# Patient Record
Sex: Male | Born: 1990
Health system: Southern US, Community
[De-identification: ages and names within clinical notes are randomized; demographics above are authoritative.]

## PROBLEM LIST (undated history)

## (undated) DIAGNOSIS — G43909 Migraine, unspecified, not intractable, without status migrainosus: Secondary | ICD-10-CM

## (undated) DIAGNOSIS — E739 Lactose intolerance, unspecified: Secondary | ICD-10-CM

## (undated) DIAGNOSIS — R42 Dizziness and giddiness: Secondary | ICD-10-CM

## (undated) DIAGNOSIS — F988 Other specified behavioral and emotional disorders with onset usually occurring in childhood and adolescence: Secondary | ICD-10-CM

## (undated) HISTORY — DX: Lactose intolerance, unspecified: E73.9

## (undated) HISTORY — DX: Other specified behavioral and emotional disorders with onset usually occurring in childhood and adolescence: F98.8

## (undated) HISTORY — DX: Migraine, unspecified, not intractable, without status migrainosus: G43.909

## (undated) HISTORY — PX: OTHER SURGICAL HISTORY: SHX169

## (undated) HISTORY — DX: Dizziness and giddiness: R42

---

## 2008-03-26 ENCOUNTER — Emergency Department (HOSPITAL_COMMUNITY): Admission: EM | Admit: 2008-03-26 | Discharge: 2008-03-27 | Payer: Self-pay | Admitting: Emergency Medicine

## 2013-04-25 LAB — LIPID PANEL
Cholesterol: 205 mg/dL — AB (ref 0–200)
HDL: 52 mg/dL (ref 35–70)
LDL CALC: 119 mg/dL
TRIGLYCERIDES: 169 mg/dL — AB (ref 40–160)

## 2013-04-25 LAB — HEPATIC FUNCTION PANEL
ALK PHOS: 61 U/L (ref 25–125)
ALT: 28 U/L (ref 10–40)
AST: 19 U/L (ref 14–40)
BILIRUBIN, TOTAL: 0.6 mg/dL

## 2016-07-22 ENCOUNTER — Encounter (HOSPITAL_COMMUNITY): Payer: Self-pay | Admitting: Emergency Medicine

## 2016-07-22 ENCOUNTER — Emergency Department (HOSPITAL_COMMUNITY)
Admission: EM | Admit: 2016-07-22 | Discharge: 2016-07-22 | Disposition: A | Discharge status: Home / Self Care | Payer: PRIVATE HEALTH INSURANCE | Attending: Emergency Medicine | Admitting: Emergency Medicine

## 2016-07-22 DIAGNOSIS — R42 Dizziness and giddiness: Secondary | ICD-10-CM | POA: Diagnosis not present

## 2016-07-22 LAB — CBC WITH DIFFERENTIAL/PLATELET
Basophils Absolute: 0.1 10*3/uL (ref 0.0–0.1)
Basophils Relative: 1 %
Eosinophils Absolute: 0.1 10*3/uL (ref 0.0–0.7)
Eosinophils Relative: 1 %
HCT: 46.5 % (ref 39.0–52.0)
Hemoglobin: 16.1 g/dL (ref 13.0–17.0)
Lymphocytes Relative: 31 %
Lymphs Abs: 2.9 10*3/uL (ref 0.7–4.0)
MCH: 29.8 pg (ref 26.0–34.0)
MCHC: 34.6 g/dL (ref 30.0–36.0)
MCV: 86 fL (ref 78.0–100.0)
Monocytes Absolute: 1.3 10*3/uL — ABNORMAL HIGH (ref 0.1–1.0)
Monocytes Relative: 14 %
Neutro Abs: 4.9 10*3/uL (ref 1.7–7.7)
Neutrophils Relative %: 53 %
Platelets: 216 10*3/uL (ref 150–400)
RBC: 5.41 MIL/uL (ref 4.22–5.81)
RDW: 12.9 % (ref 11.5–15.5)
WBC: 9.3 10*3/uL (ref 4.0–10.5)

## 2016-07-22 LAB — BASIC METABOLIC PANEL
Anion gap: 10 (ref 5–15)
BUN: 14 mg/dL (ref 6–20)
CO2: 28 mmol/L (ref 22–32)
Calcium: 9.6 mg/dL (ref 8.9–10.3)
Chloride: 102 mmol/L (ref 101–111)
Creatinine, Ser: 0.97 mg/dL (ref 0.61–1.24)
GFR calc Af Amer: >60 mL/min (ref >60)
GFR calc non Af Amer: >60 mL/min (ref >60)
Glucose, Bld: 93 mg/dL (ref 65–99)
Potassium: 4 mmol/L (ref 3.5–5.1)
Sodium: 140 mmol/L (ref 135–145)

## 2016-07-22 MED ORDER — LORAZEPAM 1 MG PO TABS
1.0000 mg | ORAL_TABLET | Freq: Once | ORAL | Status: DC
  Filled 2016-07-22: qty 1

## 2016-07-22 MED ORDER — AMOXICILLIN-POT CLAVULANATE 875-125 MG PO TABS: 1.0000 | ORAL_TABLET | Freq: Two times a day (BID) | ORAL | 0 refills | Status: DC

## 2016-07-22 NOTE — ED Notes (Signed)
Ativan to be given before MRI. MRI states that they will call when they are ready for me to give it.

## 2016-07-22 NOTE — Progress Notes (Signed)
[facility] [place]cp was [facility] [place] Needs to establish care with a pcp locally via [employer]  Using customer service or [employer] website

## 2016-07-22 NOTE — ED Notes (Signed)
Kohut, MD verbally states he does NOT need second EKG

## 2016-07-22 NOTE — ED Provider Notes (Signed)
[facility]-EMERGENCY DEPT Provider Note   CSN: [id number] Arrival date & time: 07/22/16  1251     History   Chief Complaint Chief Complaint  Patient presents with  . Dizziness    HPI Juan Boone is a 25 y.o. male.  HPI   25 year old male with dizziness. Onset on Monday. Intermittent since then. Describes sensation of feeling unsteady. It has been most noticeable when getting out of bed this morning and yesterday morning. No appreciable exacerbating relieving factors otherwise. No ear fullness or tenderness. He is having some facial pain/pressure which he feels primarily in the frontal region behind his eyes. Been evaluated twice in the past 2 days at urgent care for the same complaints. Sounds like he was diagnosed with peripheral vertigo. Is given prescription for Flonase as well as meclizine. Tried a dose of meclizine incidentally made him drowsy. Has continued to take the Flonase. No acute visual complaints. No chest pain. No dyspnea. No palpitations.  History reviewed. No pertinent past medical history.  There are no active problems to display for this patient.   History reviewed. No pertinent surgical history.     Home Medications    Prior to Admission medications   Not on File    Family History History reviewed. No pertinent family history.  Social History Social History  Substance Use Topics  . Smoking status: Never Smoker  . Smokeless tobacco: Never Used  . Alcohol use Yes     Comment: occasional     Allergies   Gluten meal and Lactose intolerance (gi)   Review of Systems Review of Systems   All systems reviewed and negative, other than as noted in HPI.   Physical Exam Updated Vital Signs BP 144/94 (BP Location: Right Arm)   Pulse 63   Temp 97.7 F (36.5 C) (Oral)   Resp 18   Wt 194 lb 1.6 oz (88 kg)   SpO2 100%   Physical Exam  Constitutional: He appears well-developed and well-nourished. No distress.  HENT:  Head: Normocephalic and  atraumatic.  Eyes: Conjunctivae and EOM are normal. Pupils are equal, round, and reactive to light. Right eye exhibits no discharge. Left eye exhibits no discharge.  Horizontal nystagmus  Neck: Neck supple.  Cardiovascular: Normal rate, regular rhythm and normal heart sounds.  Exam reveals no gallop and no friction rub.   No murmur heard. Pulmonary/Chest: Effort normal and breath sounds normal. No respiratory distress.  Abdominal: Soft. He exhibits no distension. There is no tenderness.  Musculoskeletal: He exhibits no edema or tenderness.  Neurological: He is alert.  Skin: Skin is warm and dry.  Psychiatric: He has a normal mood and affect. His behavior is normal. Thought content normal.  Speech clear. Content appropriate. Follows commands. Cranial nerves II through XII are intact bilaterally. Good finger to nose testing bilaterally. Can stand with his feet together with eyes open without any difficulty. Was very off balance when subsequently closing his eyes. Gait stready.   Nursing note and vitals reviewed.    ED Treatments / Results  Labs (all labs ordered are listed, but only abnormal results are displayed) Labs Reviewed  CBC WITH DIFFERENTIAL/PLATELET - Abnormal; Notable for the following:       Result Value   Monocytes Absolute 1.3 (*)    All other components within normal limits  BASIC METABOLIC PANEL    EKG  EKG Interpretation  Date/Time:  Thursday July 22 2016 13:20:25 EST Ventricular Rate:  57 PR Interval:    QRS Duration: 87  QT Interval:  410 QTC Calculation: 400 R Axis:   76 Text Interpretation:  Sinus rhythm Atrial premature complex Confirmed by Juleen ChinaKOHUT  MD, Jeannett SeniorSTEPHEN ([id number]) on 07/22/2016 4:11:42 PM       Radiology No results found.  Procedures Procedures (including critical care time)  Medications Ordered in ED Medications  LORazepam (ATIVAN) tablet 1 mg (not administered)     Initial Impression / Assessment and Plan / ED Course  I have reviewed  the triage vital signs and the nursing notes.  Pertinent labs & imaging results that were available during my care of the patient were reviewed by me and considered in my medical decision making (see chart for details).  Clinical Course     25 year old male with symptoms consistent with vertigo. Exam would support this as well. I suspect this is likely peripheral in etiology. This is his third evaluation for the same complaint though. Will MRI to eval for possible central cause. He states that he does sometimes "pop" his neck and back but denies any neck pain or specific precipitating event leading up to his initial symptoms. Consider vertebral dissection, but doubt.  Unfortunately, issues with ED MRI machine. Cannot obtain this in the emergency room today. We'll try to arrange this as an outpatient. Return precautions were discussed.  Final Clinical Impressions(s) / ED Diagnoses   Final diagnoses:  Vertigo    New Prescriptions New Prescriptions   No medications on file     Raeford RazorStephen Shell Blanchette, MD 07/27/16 1126

## 2016-07-22 NOTE — ED Triage Notes (Signed)
Pt c/o dizziness, disorientation, headache onset on Monday. Went to UC, UC told him dizziness was due to middle ear crystals. Headache worsened yesterday. Dizziness worsened today.

## 2016-07-25 ENCOUNTER — Ambulatory Visit (HOSPITAL_COMMUNITY): Payer: PRIVATE HEALTH INSURANCE

## 2016-07-27 ENCOUNTER — Ambulatory Visit (HOSPITAL_COMMUNITY): Admission: RE | Admit: 2016-07-27 | Payer: PRIVATE HEALTH INSURANCE | Source: Ambulatory Visit

## 2016-07-29 ENCOUNTER — Encounter: Payer: Self-pay | Admitting: Family Medicine

## 2016-07-29 ENCOUNTER — Ambulatory Visit (INDEPENDENT_AMBULATORY_CARE_PROVIDER_SITE_OTHER): Payer: PRIVATE HEALTH INSURANCE | Admitting: Family Medicine

## 2016-07-29 VITALS — BP 126/72 | HR 75 | Temp 98.7°F | Ht 69.69 in | Wt 209.0 lb

## 2016-07-29 DIAGNOSIS — R42 Dizziness and giddiness: Secondary | ICD-10-CM | POA: Diagnosis not present

## 2016-07-29 DIAGNOSIS — G43909 Migraine, unspecified, not intractable, without status migrainosus: Secondary | ICD-10-CM | POA: Insufficient documentation

## 2016-07-29 NOTE — Patient Instructions (Signed)
Glad symptoms improving  Some features of BPPV but also features of vestibular neuritis. Set up MRI with some atypical features such as the headache.   Can do vestibular rehab exercise at home. We considered steroid taper but held off for now given improvement.

## 2016-07-29 NOTE — Progress Notes (Signed)
Phone: [phone]  Subjective:  Patient presents today to establish care. Chief complaint-noted.   See problem oriented charting  The following were reviewed and entered/updated in epic: Past Medical History:  Diagnosis Date  . Migraine    when treated for ADD years ago on adderrall- resolved off this   Patient Active Problem List   Diagnosis Date Noted  . Migraine    Past Surgical History:  Procedure Laterality Date  . none      Family History  Problem Relation Age of Onset  . Adopted: Yes  . Crohn's disease Other     unknown who in family as adopted but knows a family member had this.     Medications- reviewed and updated Current Outpatient Prescriptions  Medication Sig Dispense Refill  . fexofenadine (ALLEGRA) 180 MG tablet Take 180 mg by mouth daily.    Marland Kitchen. guaiFENesin (MUCINEX) 600 MG 12 hr tablet Take 600 mg by mouth 2 (two) times daily.    Marland Kitchen. LACTASE ENZYME PO Take 1 packet by mouth 3 (three) times daily with meals as needed (lactose intolerance).    . meclizine (ANTIVERT) 25 MG tablet Take 25 mg by mouth 3 (three) times daily as needed for dizziness or nausea.     No current facility-administered medications for this visit.     Allergies-reviewed and updated Allergies  Allergen Reactions  . Gluten Meal Other (See Comments)    Gi upset  . Lactose Intolerance (Gi) Diarrhea and Other (See Comments)    Gi upset    Social History   Social History  . Marital status: Married    Spouse name: N/A  . Number of children: N/A  . Years of education: N/A   Social History Main Topics  . Smoking status: Never Smoker  . Smokeless tobacco: Never Used  . Alcohol use Yes     Comment: occasional about 1 a week  . Drug use: No  . Sexual activity: Not Asked   Other Topics Concern  . None   Social History Narrative   Marrie.d no kids. 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 Negative for fever.  HENT: Negative for ear pain, hearing loss, sinus pain and tinnitus.   Eyes: Positive for blurred vision (from vertigo). Negative for photophobia.  Respiratory: Negative for cough and shortness of breath.   Cardiovascular: Negative for chest pain and palpitations.  Gastrointestinal: Negative for heartburn and nausea.  Genitourinary: Negative for dysuria and urgency.  Musculoskeletal: Negative for back pain, myalgias and neck pain.  Skin: Negative for itching and rash.  Neurological: Positive for dizziness (vertigo) and headaches.  Endo/Heme/Allergies: Negative for polydipsia. Does not bruise/bleed easily.  Psychiatric/Behavioral: Negative for hallucinations and substance abuse.   Objective: BP 126/72 (BP Location: Left Arm, Patient Position: Sitting, Cuff Size: Normal)   Pulse 75   Temp 98.7 F (37.1 C) (Oral)   Ht 5' 9.69" (1.77 m)   Wt 209 lb (94.8 kg)   SpO2 96%   BMI 30.26 kg/m  Gen: NAD, resting comfortably HEENT: Mucous membranes are moist. Oropharynx normal. TM normal. Eyes: sclera and lids normal, PERRLA Neck: no thyromegaly, no cervical lymphadenopathy CV: RRR no murmurs rubs or gallops Lungs: CTAB no crackles, wheeze, rhonchi Abdomen: soft/nontender/nondistended/normal bowel sounds. No rebound or guarding.  Ext: no edema Skin: warm, dry Neuro: 5/5 strength in  upper and lower extremities, normal gait, normal reflexes  Assessment/Plan:  Vertigo - Plan: MR Brain W Wo Contrast S:For the past week has been in and out of urgent care for vertigo. [facility] on [facility]. Yesterday went to ENT to rule out potential concern there- ears were normal. Said consider MRI in case neurological.   Per patient- ENT thought atypical (Dr. Lazarus SalinesWolicki)- Has had a headache since last week. Vertigo stops when he stops moving.   Last Monday he woke up and felt very dizzy/disoriented, wobbling when he moved.  Felt like each movement exagerrated. Progressively got worse through the week, except better on Wednesday. Felt mild focus issues because felt like there was movement. Worse with movement. DId have nausea, not throwing up. Had some cold sweats and had a headache. Meclizine given by [facility] last monday- advised flonase and meclizine. Tuesday called out sick. [facility] [address]- gave some head exercises (exercises for BPPV worsened BPPV). Last Thursday in ER thought peripheral but plan was for MRI but machine was broken.  A/P: we discussed this was likely either BPPV or vestibular neuritis- some blended features of both (worse with head movement for BPPV with postive dix hallpike- short episodes of worsening but on other hand has low level vertigo at all times with positive head thrust). No preceeding viral illness. ED and ENT advised MRI so we discussed likely should follow through with this- with exception patient wants to cancel if he has complete resolution before scheduled which I agreed was reasonable. THe headache also atypical but very mild at this point.   Extended counseling about vertigo and potential causes. Went over causes of vertigo sheet and answered many questions.   The duration of face-to-face time during this visit was greater than 30 minutes. Greater than 50% of this time was spent in counseling as above    Orders Placed This Encounter  Procedures  . MR Brain W Wo Contrast    Standing Status:   Future    Standing Expiration Date:   09/29/2017    Order Specific Question:   If indicated for the ordered procedure, I authorize the administration of contrast media per Radiology protocol    Answer:   Yes    Order Specific Question:   Reason for Exam (SYMPTOM  OR DIAGNOSIS REQUIRED)    Answer:   Vertigo with new onset headache, leans to left side with walking. Has been advised to have MR by ENT    Order Specific Question:   Preferred imaging location?    Answer:    [facility] (table limit-350 lbs)    Order Specific Question:   What is the patient's sedation requirement?    Answer:   No Sedation    Order Specific Question:   Does the patient have a pacemaker or implanted devices?    Answer:   No    No orders of the defined types were placed in this encounter.   Return precautions advised.  Tana ConchStephen Thessaly Mccullers, MD

## 2016-07-29 NOTE — Progress Notes (Signed)
Pre visit review using our clinic review tool, if applicable. No additional management support is needed unless otherwise documented below in the visit note. 

## 2016-08-11 ENCOUNTER — Other Ambulatory Visit: Payer: PRIVATE HEALTH INSURANCE

## 2016-08-19 ENCOUNTER — Encounter: Payer: Self-pay | Admitting: Family Medicine

## 2016-09-02 ENCOUNTER — Ambulatory Visit: Payer: PRIVATE HEALTH INSURANCE | Admitting: Family Medicine

## 2016-09-07 ENCOUNTER — Other Ambulatory Visit (INDEPENDENT_AMBULATORY_CARE_PROVIDER_SITE_OTHER): Payer: 59

## 2016-09-07 DIAGNOSIS — Z0000 Encounter for general adult medical examination without abnormal findings: Secondary | ICD-10-CM

## 2016-09-07 LAB — POC URINALSYSI DIPSTICK (AUTOMATED)
Bilirubin, UA: NEGATIVE
Glucose, UA: NEGATIVE
Ketones, UA: NEGATIVE
Leukocytes, UA: NEGATIVE
NITRITE UA: NEGATIVE
PH UA: 6.5
PROTEIN UA: NEGATIVE
RBC UA: NEGATIVE
Spec Grav, UA: 1.025
UROBILINOGEN UA: 0.2

## 2016-09-07 LAB — CBC WITH DIFFERENTIAL/PLATELET
Basophils Absolute: 0.1 10*3/uL (ref 0.0–0.1)
Basophils Relative: 0.7 % (ref 0.0–3.0)
EOS PCT: 1.2 % (ref 0.0–5.0)
Eosinophils Absolute: 0.1 10*3/uL (ref 0.0–0.7)
HCT: 46 % (ref 39.0–52.0)
Hemoglobin: 15.3 g/dL (ref 13.0–17.0)
LYMPHS ABS: 2 10*3/uL (ref 0.7–4.0)
Lymphocytes Relative: 23.4 % (ref 12.0–46.0)
MCHC: 33.2 g/dL (ref 30.0–36.0)
MCV: 87.1 fl (ref 78.0–100.0)
MONOS PCT: 9.5 % (ref 3.0–12.0)
Monocytes Absolute: 0.8 10*3/uL (ref 0.1–1.0)
NEUTROS ABS: 5.7 10*3/uL (ref 1.4–7.7)
NEUTROS PCT: 65.2 % (ref 43.0–77.0)
PLATELETS: 224 10*3/uL (ref 150.0–400.0)
RBC: 5.29 Mil/uL (ref 4.22–5.81)
RDW: 13 % (ref 11.5–15.5)
WBC: 8.7 10*3/uL (ref 4.0–10.5)

## 2016-09-07 LAB — HEPATIC FUNCTION PANEL
ALBUMIN: 4.6 g/dL (ref 3.5–5.2)
ALT: 47 U/L (ref 0–53)
AST: 21 U/L (ref 0–37)
Alkaline Phosphatase: 59 U/L (ref 39–117)
Bilirubin, Direct: 0.1 mg/dL (ref 0.0–0.3)
Total Bilirubin: 0.5 mg/dL (ref 0.2–1.2)
Total Protein: 6.9 g/dL (ref 6.0–8.3)

## 2016-09-07 LAB — LIPID PANEL
CHOL/HDL RATIO: 4
CHOLESTEROL: 215 mg/dL — AB (ref 0–200)
HDL: 52.6 mg/dL (ref >39.00)
LDL Cholesterol: 142 mg/dL — ABNORMAL HIGH (ref 0–99)
NonHDL: 162.32
TRIGLYCERIDES: 100 mg/dL (ref 0.0–149.0)
VLDL: 20 mg/dL (ref 0.0–40.0)

## 2016-09-07 LAB — BASIC METABOLIC PANEL
BUN: 15 mg/dL (ref 6–23)
CALCIUM: 9.9 mg/dL (ref 8.4–10.5)
CO2: 32 mEq/L (ref 19–32)
Chloride: 104 mEq/L (ref 96–112)
Creatinine, Ser: 0.99 mg/dL (ref 0.40–1.50)
GFR: 95.9 mL/min (ref >60.00)
GLUCOSE: 95 mg/dL (ref 70–99)
POTASSIUM: 4.6 meq/L (ref 3.5–5.1)
SODIUM: 141 meq/L (ref 135–145)

## 2016-09-07 LAB — TSH: TSH: 1.32 u[IU]/mL (ref 0.35–4.50)

## 2016-09-09 ENCOUNTER — Encounter: Payer: Self-pay | Admitting: Family Medicine

## 2016-09-09 ENCOUNTER — Ambulatory Visit (INDEPENDENT_AMBULATORY_CARE_PROVIDER_SITE_OTHER): Payer: 59 | Admitting: Family Medicine

## 2016-09-09 VITALS — BP 124/62 | HR 72 | Temp 98.2°F | Ht 70.0 in | Wt 210.6 lb

## 2016-09-09 DIAGNOSIS — Z0000 Encounter for general adult medical examination without abnormal findings: Secondary | ICD-10-CM | POA: Diagnosis not present

## 2016-09-09 DIAGNOSIS — Z23 Encounter for immunization: Secondary | ICD-10-CM

## 2016-09-09 DIAGNOSIS — K9041 Non-celiac gluten sensitivity: Secondary | ICD-10-CM | POA: Insufficient documentation

## 2016-09-09 NOTE — Patient Instructions (Addendum)
Tdap today. Good for 10 years.   Love the 28 by 28 plan!!!

## 2016-09-09 NOTE — Progress Notes (Signed)
Pre visit review using our clinic review tool, if applicable. No additional management support is needed unless otherwise documented below in the visit note. 

## 2016-09-09 NOTE — Progress Notes (Signed)
Phone: [phone]  Subjective:  Patient presents today for their annual physical. Chief complaint-noted.   See problem oriented charting- ROS- full  review of systems was completed and negative including No chest pain or shortness of breath. No headache or blurry vision. Vertigo resolved  The following were reviewed and entered/updated in epic: Past Medical History:  Diagnosis Date  . Migraine    when treated for ADD years ago on adderrall- resolved off this   Patient Active Problem List   Diagnosis Date Noted  . Gluten intolerance 09/09/2016  . Migraine    Past Surgical History:  Procedure Laterality Date  . none      Family History  Problem Relation Age of Onset  . Adopted: Yes  . Crohn's disease Other     unknown who in family as adopted but knows a family member had this.     Medications- reviewed and updated Current Outpatient Prescriptions  Medication Sig Dispense Refill  . LACTASE ENZYME PO Take 1 packet by mouth 3 (three) times daily with meals as needed (lactose intolerance).     No current facility-administered medications for this visit.     Allergies-reviewed and updated Allergies  Allergen Reactions  . Gluten Meal Other (See Comments)    Gi upset  . Lactose Intolerance (Gi) Diarrhea and Other (See Comments)    Gi upset    Social History   Social History  . Marital status: Married    Spouse name: N/A  . Number of children: N/A  . Years of education: N/A   Social History Main Topics  . Smoking status: Never Smoker  . Smokeless tobacco: Never Used  . Alcohol use Yes     Comment: occasional about 1 a week  . Drug use: No  . Sexual activity: Not Asked   Other Topics Concern  . None   Social History Narrative   Marrie.d no kids. 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 Oropharynx normal Neck: no thyromegaly CV: RRR no murmurs rubs or gallops Lungs: CTAB no crackles, wheeze, rhonchi Abdomen: soft/nontender/nondistended/normal bowel sounds. No rebound or guarding.  Ext: no edema Skin: warm, dry Neuro: grossly normal, moves all extremities, PERRLA  Declines GU exam  Assessment/Plan:  26 y.o. male presenting for annual physical.  Health Maintenance counseling: 1. Anticipatory guidance: Patient counseled regarding regular dental exams - first visit tomorrow, eye exams no issues, wearing seatbelts.  2. Risk factor reduction:  Advised patient of need for regular exercise and diet rich and fruits and vegetables to reduce risk of heart attack and stroke. Exercise- goal 150 minutes a week, only doing 1-2 days a week 4-5 days had been doing previously. Diet-eats too much at [employer]- not always picking great options, needs more meal planning. Wants to target 165-170. Lowest he has been since college 177. Wants to lose 28 lbs by time of turning 26.  Wt Readings from Last 3 Encounters:  09/09/16 210 lb 9.6 oz (95.5 kg)  07/29/16 209 lb (94.8 kg)  07/22/16 194 lb 1.6 oz (88 kg)  3.  Immunizations/screenings/ancillary studies Immunization History  Administered Date(s) Administered  . Influenza-Unspecified 08/26/2016   Health Maintenance Due  Topic Date Due  . HIV Screening - future labs 01/05/2004  . TETANUS/TDAP - today 01/05/2008   4. Prostate cancer screening- unknown family history, plan to start at 50  5. Colon cancer screening - unknown family history, plan to start at 42. Family history of crohns but no abdominal pain or blood stool 6. Skin cancer screening/prevention- advised regular sunscreen use.  7. Testicular cancer screening- advised monthly self exams  8. STD screening- patient  opts out- monogomous  Status of chronic or acute concerns  Avoiding gluten  Migraine free without add meds  Vertigo resolved. Did not get MRI as a result.   Return in about 1 year (around 09/09/2017).  Return precautions advised.   Tana Conch, MD

## 2017-05-19 ENCOUNTER — Encounter: Payer: Self-pay | Admitting: Internal Medicine

## 2017-07-13 ENCOUNTER — Ambulatory Visit (INDEPENDENT_AMBULATORY_CARE_PROVIDER_SITE_OTHER): Payer: 59 | Admitting: Internal Medicine

## 2017-07-13 ENCOUNTER — Encounter: Payer: Self-pay | Admitting: Internal Medicine

## 2017-07-13 ENCOUNTER — Other Ambulatory Visit (INDEPENDENT_AMBULATORY_CARE_PROVIDER_SITE_OTHER): Payer: 59

## 2017-07-13 VITALS — BP 116/80 | HR 64 | Ht 69.5 in | Wt 223.5 lb

## 2017-07-13 DIAGNOSIS — R109 Unspecified abdominal pain: Secondary | ICD-10-CM | POA: Diagnosis not present

## 2017-07-13 DIAGNOSIS — R197 Diarrhea, unspecified: Secondary | ICD-10-CM

## 2017-07-13 DIAGNOSIS — K219 Gastro-esophageal reflux disease without esophagitis: Secondary | ICD-10-CM

## 2017-07-13 LAB — IGA: IgA: 84 mg/dL (ref 68–378)

## 2017-07-13 MED ORDER — NA SULFATE-K SULFATE-MG SULF 17.5-3.13-1.6 GM/177ML PO SOLN: 1.0000 | Freq: Once | ORAL | 0 refills | Status: AC

## 2017-07-13 NOTE — Progress Notes (Signed)
HISTORY OF PRESENT ILLNESS:  Juan Boone is a 26 y.o. 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 The patient reports that he has had intermittent problems with "upset stomach" his entire life dating back to grade school. He was told that he is lactose intolerance and has been using Lactaid with dairy products. Despite this he reports 2-4 generally loose bowel movements daily, mostly in the morning. At times he will have postprandial lower abdominal cramping discomfort with moderate urgency followed by diarrhea. He may use the bathroom several times. After completing evacuation, the abdomen feels better. No bleeding. He reports that he will have an episode at least once per week. He also experiences nocturnal symptoms 2 or 3 times per month. He has not noticed any particular food items that reliably reproduce his symptoms. He did try gluten-free diet for approximately one year with modest benefit. He has resumed gluten in his diet for the past 3 months. He is also tried probiotics and decreased vegetable consumption without benefit. He denies weight loss. Actually, he has had 35 pound weight gain over the past 3 years. He does complain of active acid reflux symptoms 2 or 3 times per week. He will use antacids such as Tums on demand. No dysphagia. Occasional nausea. Patient's family history is uncertain as he is adopted. However he did find out that there is a family history of Crohn's disease. Stress may exacerbate symptoms at times. He denies issues with constipation. Finally, he describes a perirectal "bump" of uncertain significance. Nonpainful. Review of outside laboratories from earlier this year finds normal CBC, comprehensive metabolic panel, and TSH. No relevant radiology.  REVIEW OF SYSTEMS:  All non-GI  ROS negative unless otherwise stated in the history of present illness except for headaches, joint aches  Past Medical History:  Diagnosis Date  . ADD (attention deficit disorder)   . Lactose intolerance   . Migraine    when treated for ADD years ago on adderrall- resolved off this  . Vertigo     Past Surgical History:  Procedure Laterality Date  . none      Social History Juan Boone  reports that  has never smoked. he has never used smokeless tobacco. He reports that he drinks alcohol. He reports that he does not use drugs.  family history includes Crohn's disease in his other. He was adopted.  Allergies  Allergen Reactions  . Gluten Meal Other (See Comments)    Gi upset  . Lactose Intolerance (Gi) Diarrhea and Other (See Comments)    Gi upset  . Aleve [Naproxen Sodium] Other (See Comments)    GI upset       PHYSICAL EXAMINATION: Vital signs: BP 116/80 (BP Location: Left Arm, Patient Position: Sitting, Cuff Size: Normal)   Pulse 64   Ht 5' 9.5" (1.765 m) Comment: height measured without shoes  Wt 223 lb 8 oz (101.4 kg)   BMI 32.53 kg/m   Constitutional: generally well-appearing, no acute distress Psychiatric: alert and oriented x3, cooperative Eyes: extraocular movements intact, anicteric, conjunctiva pink Mouth: oral pharynx moist, no lesions Neck: supple no lymphadenopathy Cardiovascular: heart regular rate and rhythm, no murmur Lungs: clear to auscultation bilaterally Abdomen: soft, nontender, nondistended, no obvious ascites, no peritoneal signs, normal bowel sounds, no organomegaly Rectal: Deferred until colonoscopy Extremities: no clubbing, cyanosis, or lower extremity edema bilaterally Skin: no  lesions on visible extremities Neuro: No focal deficits. Cranial nerves intact  ASSESSMENT:  #1. Long-standing problems with increased frequency of bowel movements with intermittent episodes of postprandial abdominal cramping with urgency as described. Suspect  irritable bowel syndrome. Rule out organic entities such as macroscopic IBD or microscopic colitis. Questionable response to gluten-free diet as reported #2. Family history of Crohn's disease #3. Perirectal lesion to be evaluated #4. Chronic GERD   PLAN:  #1. Screen for celiac disease with tissue transglutaminase antibody IgA and serum IgA level #2. Schedule colonoscopy to evaluate chronic intermittent diarrhea and abdominal pain. Rule out IBD or microscopic colitis.The nature of the procedure, as well as the risks, benefits, and alternatives were carefully and thoroughly reviewed with the patient. Ample time for discussion and questions allowed. The patient understood, was satisfied, and agreed to proceed. #3. Upper endoscopy to evaluate chronic reflux symptoms and abdominal discomfort. Biopsies for celiac necessary.The nature of the procedure, as well as the risks, benefits, and alternatives were carefully and thoroughly reviewed with the patient. Ample time for discussion and questions allowed. The patient understood, was satisfied, and agreed to proceed. #4. If the above workup negative then symptomatic therapies to include fiber, antispasmodics, Xifaxan, IBgard, and the like. #5. Reflux precautions with attention to weight loss #6. A benefit from H2 receptor antagonist therapy or PPI given frequency of symptoms. We will decide post endoscopy to rule out mucosal abnormalities such as esophagitis  A copy of this consultation note has been sent to Dr. Durene CalHunter

## 2017-07-13 NOTE — Patient Instructions (Signed)
Your physician has requested that you go to the basement for the following lab work before leaving today:  TTG, IGA  You have been scheduled for an endoscopy and colonoscopy. Please follow the written instructions given to you at your visit today. Please pick up your prep supplies at the pharmacy within the next 1-3 days. If you use inhalers (even only as needed), please bring them with you on the day of your procedure. Your physician has requested that you go to www.startemmi.com and enter the access code given to you at your visit today. This web site gives a general overview about your procedure. However, you should still follow specific instructions given to you by our office regarding your preparation for the procedure.   

## 2017-07-14 LAB — TISSUE TRANSGLUTAMINASE, IGA: (tTG) Ab, IgA: 1 U/mL

## 2017-07-22 DIAGNOSIS — Z23 Encounter for immunization: Secondary | ICD-10-CM | POA: Diagnosis not present

## 2017-09-08 ENCOUNTER — Encounter: Payer: 59 | Admitting: Internal Medicine

## 2017-09-13 ENCOUNTER — Encounter: Payer: Self-pay | Admitting: Internal Medicine

## 2017-09-26 ENCOUNTER — Ambulatory Visit (AMBULATORY_SURGERY_CENTER): Payer: 59 | Admitting: Internal Medicine

## 2017-09-26 ENCOUNTER — Other Ambulatory Visit: Payer: Self-pay

## 2017-09-26 ENCOUNTER — Encounter: Payer: Self-pay | Admitting: Internal Medicine

## 2017-09-26 VITALS — BP 133/75 | HR 59 | Temp 97.1°F | Resp 10 | Ht 69.5 in | Wt 223.0 lb

## 2017-09-26 DIAGNOSIS — R109 Unspecified abdominal pain: Secondary | ICD-10-CM

## 2017-09-26 DIAGNOSIS — K219 Gastro-esophageal reflux disease without esophagitis: Secondary | ICD-10-CM

## 2017-09-26 DIAGNOSIS — R197 Diarrhea, unspecified: Secondary | ICD-10-CM

## 2017-09-26 DIAGNOSIS — K2100 Gastro-esophageal reflux disease with esophagitis, without bleeding: Secondary | ICD-10-CM

## 2017-09-26 DIAGNOSIS — R42 Dizziness and giddiness: Secondary | ICD-10-CM | POA: Diagnosis not present

## 2017-09-26 MED ORDER — DICYCLOMINE HCL 10 MG PO CAPS
20.0000 mg | ORAL_CAPSULE | Freq: Four times a day (QID) | ORAL | 3 refills | Status: DC | PRN
Start: 2017-09-26 — End: 2019-02-07

## 2017-09-26 MED ORDER — SODIUM CHLORIDE 0.9 % IV SOLN
500.0000 mL | Freq: Once | INTRAVENOUS | Status: DC
Start: 2017-09-26 — End: 2021-08-24

## 2017-09-26 MED ORDER — OMEPRAZOLE 40 MG PO CPDR: 40.0000 mg | DELAYED_RELEASE_CAPSULE | Freq: Every day | ORAL | 11 refills | Status: DC

## 2017-09-26 NOTE — Progress Notes (Signed)
Report to PACU, RN, vss, BBS= Clear.  

## 2017-09-26 NOTE — Progress Notes (Signed)
Pt. Reports no change in his medical or surgical history since his pre-visit 07/13/2017.

## 2017-09-26 NOTE — Progress Notes (Signed)
Called to room to assist during endoscopic procedure.  Patient ID and intended procedure confirmed with present staff. Received instructions for my participation in the procedure from the performing physician.  

## 2017-09-26 NOTE — Op Note (Signed)
[facility] Patient Name: Juan Boone Procedure Date: 09/26/2017 1:26 PM MRN: [id number] Endoscopist: [name]. Juan Boone , MD Age: 27 Referring MD:  Date of Birth: 09-12-1990 Gender: Male Account #: [id number] Procedure:                Upper GI endoscopy Indications:              Abdominal pain, Heartburn Medicines:                Monitored Anesthesia Care Procedure:                Pre-Anesthesia Assessment:                           - Prior to the procedure, a History and Physical                            was performed, and patient medications and                            allergies were reviewed. The patient's tolerance of                            previous anesthesia was also reviewed. The risks                            and benefits of the procedure and the sedation                            options and risks were discussed with the patient.                            All questions were answered, and informed consent                            was obtained. Prior Anticoagulants: The patient has                            taken no previous anticoagulant or antiplatelet                            agents. ASA Grade Assessment: I - A normal, healthy                            patient. After reviewing the risks and benefits,                            the patient was deemed in satisfactory condition to                            undergo the procedure.                           After obtaining informed consent, the endoscope was  passed under direct vision. Throughout the                            procedure, the patient's blood pressure, pulse, and                            oxygen saturations were monitored continuously. The                            Endoscope was introduced through the mouth, and                            advanced to the second part of duodenum. The upper                            GI endoscopy was accomplished without  difficulty.                            The patient tolerated the procedure well. Scope In: Scope Out: Findings:                 LA Grade B (one or more mucosal breaks greater than                            5 mm, not extending between the tops of two mucosal                            folds) esophagitis was found.                           The esophagus was normal. No Barrett's esophagus.                           The stomach was normal. Small hiatal hernia.                           The examined duodenum was normal.                           The cardia and gastric fundus were normal on                            retroflexion. Complications:            No immediate complications. Estimated Blood Loss:     Estimated blood loss: none. Impression:               1. GERD with erosive esophagitis. Recommendation:           1. Prescribe omeprazole 40 mg daily; #30; 11 refills                           2. Reflux precautions                           3. Office follow-up with Dr. Marina GoodellPerry in 6-8 weeks. Wilhemina BonitoJohn N.  Juan Goodell, MD 09/26/2017 1:51:44 PM This report has been signed electronically.

## 2017-09-26 NOTE — Progress Notes (Signed)
No problems noted in the recovery room. maw 

## 2017-09-26 NOTE — Patient Instructions (Addendum)
YOU HAD AN ENDOSCOPIC PROCEDURE TODAY AT [facility]:   Refer to the procedure report that was given to you for any specific questions about what was found during the examination.  If the procedure report does not answer your questions, please call your gastroenterologist to clarify.  If you requested that your care partner not be given the details of your procedure findings, then the procedure report has been included in a sealed envelope for you to review at your convenience later.  YOU SHOULD EXPECT: Some feelings of bloating in the abdomen. Passage of more gas than usual.  Walking can help get rid of the air that was put into your GI tract during the procedure and reduce the bloating. If you had a lower endoscopy (such as a colonoscopy or flexible sigmoidoscopy) you may notice spotting of blood in your stool or on the toilet paper. If you underwent a bowel prep for your procedure, you may not have a normal bowel movement for a few days.  Please Note:  You might notice some irritation and congestion in your nose or some drainage.  This is from the oxygen used during your procedure.  There is no need for concern and it should clear up in a day or so.  SYMPTOMS TO REPORT IMMEDIATELY:   Following lower endoscopy (colonoscopy or flexible sigmoidoscopy):  Excessive amounts of blood in the stool  Significant tenderness or worsening of abdominal pains  Swelling of the abdomen that is new, acute  Fever of 100F or higher   Following upper endoscopy (EGD)  Vomiting of blood or coffee ground material  New chest pain or pain under the shoulder blades  Painful or persistently difficult swallowing  New shortness of breath  Fever of 100F or higher  Black, tarry-looking stools  For urgent or emergent issues, a gastroenterologist can be reached at any hour by calling [phone].   DIET:  We do recommend a small meal at first, but then you may proceed to your regular diet.  Drink  plenty of fluids but you should avoid alcoholic beverages for 24 hours.  ACTIVITY:  You should plan to take it easy for the rest of today and you should NOT DRIVE or use heavy machinery until tomorrow (because of the sedation medicines used during the test).    FOLLOW UP: Our staff will call the number listed on your records the next business day following your procedure to check on you and address any questions or concerns that you may have regarding the information given to you following your procedure. If we do not reach you, we will leave a message.  However, if you are feeling well and you are not experiencing any problems, there is no need to return our call.  We will assume that you have returned to your regular daily activities without incident.  If any biopsies were taken you will be contacted by phone or by letter within the next 1-3 weeks.  Please call us at [phone] if you have not heard about the biopsies in 3 weeks.    SIGNATURES/CONFIDENTIALITY: You and/or your care partner have signed paperwork which will be entered into your electronic medical record.  These signatures attest to the fact that that the information above on your After Visit Summary has been reviewed and is understood.  Full responsibility of the confidentiality of this discharge information lies with you and/or your care-partner.    Handouts were given to your care partner on hemorrhoids,  GERD, and a hiatal hernia. Initiate Metamucil 2 TBSP daily in 12-14 ounces of water or juice. Rx sent in for Bentyl 20 mg -  by mouth every 6 hours as needed for abdominal pain. Rx sent in for Omeprazole 40 mg 1 daily on an empty stomach. 20 - 30 minutes before breakfast. Office visit with Dr. Marina GoodellPerry to be scheduled by the office nurse  For 6 - 8 weeks. You may resume your current medications today. Await biopsy results. Please call if any questions or concerns.

## 2017-09-26 NOTE — Op Note (Signed)
[facility] Patient Name: Juan Boone Procedure Date: 09/26/2017 1:26 PM MRN: [id number] Endoscopist: [name]. [name] , MD Age: 27 Referring MD:  Date of Birth: [birth year] Gender: Male Account #: [id number] Procedure:                Colonoscopy, with biopsies Indications:              Abdominal pain, Chronic diarrhea Medicines:                Monitored Anesthesia Care Procedure:                Pre-Anesthesia Assessment:                           - Prior to the procedure, a History and Physical                            was performed, and patient medications and                            allergies were reviewed. The patient's tolerance of                            previous anesthesia was also reviewed. The risks                            and benefits of the procedure and the sedation                            options and risks were discussed with the patient.                            All questions were answered, and informed consent                            was obtained. Prior Anticoagulants: The patient has                            taken no previous anticoagulant or antiplatelet                            agents. ASA Grade Assessment: I - A normal, healthy                            patient. After reviewing the risks and benefits,                            the patient was deemed in satisfactory condition to                            undergo the procedure.                           After obtaining informed consent, the colonoscope  was passed under direct vision. Throughout the                            procedure, the patient's blood pressure, pulse, and                            oxygen saturations were monitored continuously. The                            Colonoscope was introduced through the anus and                            advanced to the the cecum, identified by                            appendiceal orifice and ileocecal  valve. The                            terminal ileum, ileocecal valve, appendiceal                            orifice, and rectum were photographed. The quality                            of the bowel preparation was excellent. The                            colonoscopy was performed without difficulty. The                            patient tolerated the procedure well. The bowel                            preparation used was SUPREP. Scope In: 1:30:54 PM Scope Out: 1:38:28 PM Scope Withdrawal Time: 0 hours 6 minutes 35 seconds  Total Procedure Duration: 0 hours 7 minutes 34 seconds  Findings:                 The terminal ileum appeared normal.                           The entire examined colon appeared normal on direct                            and retroflexion views. Biopsies for histology were                            taken with a cold forceps from the entire colon for                            evaluation of microscopic colitis.                           Internal hemorrhoids were found during  retroflexion. The hemorrhoids were small. Complications:            No immediate complications. Estimated blood loss:                            None. Estimated Blood Loss:     Estimated blood loss: none. Impression:               - The examined portion of the ileum was normal.                           - The entire examined colon is normal on direct and                            retroflexion views.                           - Internal hemorrhoids. Recommendation:           1. Follow-up biopsies                           2. Initiate Metamucil 2 tablespoons daily in 12-14                            ounces of water or juice.                           3. Prescribed Bentyl 20 mg; #30; 3 refills. Take 1                            by mouth every 6 hours as needed for abdominal pain                           4. Upper endoscopy today. Please see report                            5. Office follow-up with Dr. [name] in 6-8 weeks. [name]. [name], MD 09/26/2017 1:48:13 PM This report has been signed electronically.

## 2017-09-27 ENCOUNTER — Telehealth: Payer: Self-pay

## 2017-09-27 NOTE — Telephone Encounter (Signed)
  Follow up Call-  Call back number 09/26/2017  Post procedure Call Back phone  # [phone]  Permission to leave phone message Yes  Some recent data might be hidden     Patient questions:  Do you have a fever, pain , or abdominal swelling? No. Pain Score  0 *  Have you tolerated food without any problems? Yes.    Have you been able to return to your normal activities? Yes.    Do you have any questions about your discharge instructions: Diet   No. Medications  No. Follow up visit  No.  Do you have questions or concerns about your Care? No.  Actions: * If pain score is 4 or above: No action needed, pain <4.

## 2017-09-30 ENCOUNTER — Encounter: Payer: Self-pay | Admitting: Internal Medicine

## 2017-10-26 MED FILL — OMEPRAZOLE DR 40 MG CAPSULE: 40 | 30 days supply | Qty: 30 | Fill #0

## 2017-11-10 ENCOUNTER — Ambulatory Visit: Payer: 59 | Admitting: Internal Medicine

## 2017-12-12 MED FILL — OMEPRAZOLE DR 40 MG CAPSULE: 40 | 30 days supply | Qty: 30 | Fill #1

## 2017-12-16 ENCOUNTER — Ambulatory Visit (INDEPENDENT_AMBULATORY_CARE_PROVIDER_SITE_OTHER): Payer: 59 | Admitting: Internal Medicine

## 2017-12-16 ENCOUNTER — Encounter: Payer: Self-pay | Admitting: Internal Medicine

## 2017-12-16 VITALS — BP 124/80 | HR 66 | Ht 69.5 in | Wt 221.0 lb

## 2017-12-16 DIAGNOSIS — K589 Irritable bowel syndrome without diarrhea: Secondary | ICD-10-CM | POA: Diagnosis not present

## 2017-12-16 DIAGNOSIS — R197 Diarrhea, unspecified: Secondary | ICD-10-CM

## 2017-12-16 DIAGNOSIS — K219 Gastro-esophageal reflux disease without esophagitis: Secondary | ICD-10-CM

## 2017-12-16 DIAGNOSIS — K2100 Gastro-esophageal reflux disease with esophagitis, without bleeding: Secondary | ICD-10-CM

## 2017-12-16 MED ORDER — RIFAXIMIN 550 MG PO TABS: 550.0000 mg | ORAL_TABLET | Freq: Three times a day (TID) | ORAL | 0 refills | Status: DC

## 2017-12-16 MED ORDER — OMEPRAZOLE 40 MG PO CPDR: 40.0000 mg | DELAYED_RELEASE_CAPSULE | Freq: Every day | ORAL | 11 refills | Status: DC

## 2017-12-16 NOTE — Progress Notes (Signed)
HISTORY OF PRESENT ILLNESS:  Juan Boone is a 27 y.o. male , at 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 He was initially evaluated 07/13/2017 regarding long-standing problems with increased frequency of bowel movements, abdominal cramping with urgency, chronic GERD. See that dictation for details. He subsequently underwent celiac testing which was negative. Thereafter complete colonoscopy and upper endoscopy were performed 09/26/2017. Colonoscopy including intubation of the terminal ileum was normal. Random colon biopsies were normal. Upper endoscopy revealed active esophagitis. He was prescribed omeprazole 40 mg daily. Also told to take Metamucil 2 tablespoons daily and Bentyl as needed for abdominal pain. The patient tells me that omeprazole has completely eliminated his reflux symptoms. He is pleased. Initially he describes decreased frequency of bowel movements with Metamucil. Thereafter had several episodes of abdominal bloating discomfort which she attributed to Metamucil. He has used Bentyl sparingly which seemed to help. No new complaints.  REVIEW OF SYSTEMS:  All non-GI ROS negative except for a sinus and allergy, sore throat, headache,  Past Medical History:  Diagnosis Date  . ADD (attention deficit disorder)   . Lactose intolerance   . Migraine    when treated for ADD years ago on adderrall- resolved off this  . Vertigo     Past Surgical History:  Procedure Laterality Date  . none      Social History Juan Boone  reports that he has never smoked. He has never used smokeless tobacco. He reports that he drinks alcohol. He reports that he does not use drugs.  family history includes Crohn's disease in his other. He was adopted.  Allergies  Allergen Reactions  . Gluten Meal Other (See Comments)    Gi upset  . Lactose Intolerance (Gi) Diarrhea and Other (See Comments)    Gi upset  .  Aleve [Naproxen Sodium] Other (See Comments)    GI upset       PHYSICAL EXAMINATION: Vital signs: BP 124/80   Pulse 66   Ht 5' 9.5" (1.765 m)   Wt 221 lb (100.2 kg)   SpO2 99%   BMI 32.17 kg/m   Constitutional: generally well-appearing, no acute distress Psychiatric: alert and oriented x3, cooperative Eyes: extraocular movements intact, anicteric, conjunctiva pink Mouth: oral pharynx moist, no lesions Neck: supple no lymphadenopathy Cardiovascular: heart regular rate and rhythm, no murmur Lungs: clear to auscultation bilaterally Abdomen: soft, nontender, nondistended, no obvious ascites, no peritoneal signs, normal bowel sounds, no organomegaly Rectal:omitted Extremities: no clubbing cyanosis or lower extremity edema bilaterally Skin: no lesions on visible extremities Neuro: No focal deficits. Cranial nerves intact  ASSESSMENT:  #1. GERD with endoscopic evidence of esophagitis. Asymptomatic on PPI #2. Diarrhea predominant irritable bowel syndrome. Ongoing   PLAN:  #1. Reflux precautions #2. Reflux precaution sheet and diet provided #3. Continue omeprazole 40 mg daily #4. Prescribe Xifaxan 550 mg 3 times a day 2 weeks #5. Continue Bentyl as needed for pain #6. Routine office follow-up 3 months  25 minutes spent face-to-face with the patient. Greater than 50% a time use for counseling regarding his GERD and chronic irritable bowel complaints

## 2017-12-16 NOTE — Patient Instructions (Signed)
We have sent the following medications to your pharmacy for you to pick up at your convenience:  Omeprazole  We have sent your demographic information and a prescription for Xifaxan to [facility]. This pharmacy is able to get medication approved through insurance and get you the lowest copay possible. If you have not heard from them within 1 week, please call our office at [phone] to let us know.  Please follow up in 3 months

## 2018-01-13 ENCOUNTER — Encounter

## 2018-02-03 ENCOUNTER — Ambulatory Visit: Payer: Self-pay | Admitting: Family Medicine

## 2018-02-03 VITALS — BP 120/82 | HR 63 | Temp 98.1°F | Resp 20 | Ht 70.0 in | Wt 225.0 lb

## 2018-02-03 DIAGNOSIS — Z0000 Encounter for general adult medical examination without abnormal findings: Secondary | ICD-10-CM

## 2018-02-03 NOTE — Patient Instructions (Signed)

## 2018-02-03 NOTE — Progress Notes (Signed)
Juan Boone is a 27 y.o. male who presents today with concerns of a need for an annual exam for employment. He does have a PCP, Dr. Durene CalHunter. He denies chronic conditions but does report a condition of a hiatal hernia. He states that he received regular and frequent care.  Review of Systems  Constitutional: Negative for chills, fever and malaise/fatigue.  HENT: Negative for congestion, ear discharge, ear pain, sinus pain and sore throat.   Eyes: Negative.   Respiratory: Negative for cough, sputum production and shortness of breath.   Cardiovascular: Negative.  Negative for chest pain.  Gastrointestinal: Negative for abdominal pain, diarrhea, nausea and vomiting.  Genitourinary: Negative for dysuria, frequency, hematuria and urgency.  Musculoskeletal: Negative for myalgias.  Skin: Negative.   Neurological: Negative for headaches.  Endo/Heme/Allergies: Negative.   Psychiatric/Behavioral: Negative.     O: Vitals:   02/03/18 1840  BP: 120/82  Pulse: 63  Resp: 20  Temp: 98.1 F (36.7 C)  SpO2: 98%     Physical Exam  Constitutional: He is oriented to person, place, and time. Vital signs are normal. He appears well-developed and well-nourished. He is active.  Non-toxic appearance. He does not have a sickly appearance.  HENT:  Head: Normocephalic.  Right Ear: Hearing, tympanic membrane, external ear and ear canal normal.  Left Ear: Hearing, tympanic membrane, external ear and ear canal normal.  Nose: Nose normal.  Mouth/Throat: Uvula is midline and oropharynx is clear and moist.  Neck: Normal range of motion. Neck supple.  Cardiovascular: Normal rate, regular rhythm, normal heart sounds and normal pulses.  Pulmonary/Chest: Effort normal and breath sounds normal.  Abdominal: Soft. Bowel sounds are normal.  Musculoskeletal: Normal range of motion.  Lymphadenopathy:       Head (right side): No submental and no submandibular adenopathy present.       Head (left side): No submental  and no submandibular adenopathy present.    He has no cervical adenopathy.  Neurological: He is alert and oriented to person, place, and time.  Psychiatric: He has a normal mood and affect. His speech is normal and behavior is normal. Cognition and memory are normal.  PHQ-9 negative  Vitals reviewed.  A: 1. Physical exam    P: Exam findings, diagnosis etiology and medication use and indications reviewed with patient. Follow- Up and discharge instructions provided. No emergent/urgent issues found on exam.  Patient verbalized understanding of information provided and agrees with plan of care (POC), all questions answered.  1. Physical exam WNL

## 2018-02-07 NOTE — Progress Notes (Signed)
Dr. Durene CalHunter, it appears that [facility] are scheduling the appointments not through mychart just through epic. I am not sure why, Lea are you aware of any additional information that could help with this?

## 2018-02-24 ENCOUNTER — Encounter: Payer: 59 | Admitting: Family Medicine

## 2018-02-27 NOTE — Progress Notes (Signed)
I left a voicemail for the patient to call back to schedule physical for next year. Awaiting patient's call to schedule.

## 2018-03-22 NOTE — Progress Notes (Signed)
LVM for patient to schedule physical  °

## 2018-03-22 NOTE — Progress Notes (Signed)
Please schedule physical for patient

## 2018-06-19 DIAGNOSIS — H52223 Regular astigmatism, bilateral: Secondary | ICD-10-CM | POA: Diagnosis not present

## 2019-02-06 NOTE — Progress Notes (Signed)
Phone: [phone]    Subjective:  Patient presents today for their annual physical. Chief complaint-noted.   See problem oriented charting- ROS- full  review of systems was completed and negative except for: reflux if misses PPI  The following were reviewed and entered/updated in epic: Past Medical History:  Diagnosis Date   ADD (attention deficit disorder)    Lactose intolerance    Migraine    when treated for ADD years ago on adderrall- resolved off this   Vertigo    Patient Active Problem List   Diagnosis Date Noted   Gluten intolerance 09/09/2016   Migraine    Past Surgical History:  Procedure Laterality Date   none      Family History  Adopted: Yes  Problem Relation Age of Onset   Crohn's disease Other        unknown who in family as adopted but knows a family member had this.     Medications- reviewed and updated Current Outpatient Medications  Medication Sig Dispense Refill   B Complex Vitamins (B COMPLEX PO) Take by mouth.     BIOTIN PO Take by mouth.     LACTASE ENZYME PO Take 1 packet by mouth 3 (three) times daily with meals as needed (lactose intolerance).     omeprazole (PRILOSEC) 40 MG capsule Take 1 capsule (40 mg total) by mouth daily. (Patient taking differently: Take 10 mg by mouth daily. ) 30 capsule 11   Probiotic Product (PROBIOTIC PO) Take by mouth.     Current Facility-Administered Medications  Medication Dose Route Frequency Provider Last Rate Last Dose   0.9 %  sodium chloride infusion  500 mL Intravenous Once Irene Shipper, MD        Allergies-reviewed and updated Allergies  Allergen Reactions   Gluten Meal Other (See Comments)    Gi upset   Lactose Intolerance (Gi) Diarrhea and Other (See Comments)    Gi upset   Aleve [Naproxen Sodium] Other (See Comments)    GI upset    Social History   Social History Narrative   Marrie.d no kids. 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 Oropharynx normal Neck: no thyromegaly CV: RRR no murmurs rubs or gallops Lungs: CTAB no crackles, wheeze, rhonchi Abdomen: soft/nontender/nondistended/normal bowel sounds. No rebound or guarding. Stretch marks from weight loss Ext: no edema Skin: warm, dry Neuro: grossly normal, moves all extremities, PERRLA Msk: with grip and pressure on thenar eminence patient with discomfort in the area- good strength though    Assessment and Plan:  28 y.o. male presenting for annual physical.  Health Maintenance counseling: 1. Anticipatory guidance: Patient counseled regarding regular dental exams -q6 months, eye exams - no issues,  avoiding smoking and second hand smoke , limiting alcohol to 2 beverages per day.   2. Risk factor reduction:  Advised patient of need for regular exercise and diet rich and fruits and vegetables to reduce risk of heart attack and stroke. Exercise- running 4-5 days a week lately for  3-5 miles. Diet-doing weight watchers. Has lost signficant weight! 34 lbs down.  Wt Readings from Last 3 Encounters:  02/07/19 191 lb 6.4 oz (86.8 kg)  02/03/18 225 lb (102.1 kg)  12/16/17 221 lb (100.2 kg)  3. Immunizations/screenings/ancillary studies- up to date  Immunization History  Administered Date(s) Administered   Influenza-Unspecified 08/26/2016   Tdap 09/09/2016  4. Prostate cancer screening- unknown family history-start at age 30 or 3455  5. Colon cancer screening - unknown family history- Is aware of family history of Crohn's but he has no abdominal pain or blood in the stool. Had colonoscopy last year and was reassuring- also EGD.  Had this with Dr. Marina GoodellPerry- consider 10  year follow up 6. Skin cancer screening/prevention- has seen derm in past but not recently .advised regular sunscreen use. Denies worrisome, changing, or new skin lesions.  7. Testicular cancer screening- advised monthly self exams  8. STD screening- patient opts out as monogamous. Including hiv screen 9. Never smoker  Status of chronic or acute concerns   # recently laid off from [employer] in [place]. Had been a really good place to hunt. 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 Looking into something that is similar factor. Wife still able to work at [employer]. Drawing unemployment and getting extension right now.   #For last few years- if applies too much pressure or thumb pads at the thenar eminence pain. Worse with the thumb but also can happen with other fingers- gets cramping sensation and pain - trial thumb spica splint on one hand at a time for 2 weeks -If effective likely an overuse injury -if not effective- send me a message or call us and we can get you in with Dr. Katrinka BlazingSmith of sports medicine  Mild hyperlipidemia-update lipids today though unlikely to start statin unless LDL over 190  Migraines-free of headaches without ADD medications.  ADD remains untreated currently.  GERD-has done well with just 10 mg omeprazole lately (cut 20 mg in half starting yesterday)-discussed could also trial Pepcid. Hiatal hernia contributes- noted on egd  Not avoiding gluten right now and doing ok. dairy still bothers him so dose lactaid  No problem-specific Assessment & Plan notes found for this encounter.   No future appointments. No follow-ups on file.  Lab/Order associations:Coffee with sugar-free creamer  No orders of the defined types were placed in this encounter.   Return precautions advised.   Tana ConchStephen Berthold Glace, MD

## 2019-02-07 ENCOUNTER — Ambulatory Visit (INDEPENDENT_AMBULATORY_CARE_PROVIDER_SITE_OTHER): Payer: 59 | Admitting: Family Medicine

## 2019-02-07 ENCOUNTER — Other Ambulatory Visit: Payer: Self-pay

## 2019-02-07 ENCOUNTER — Encounter: Payer: Self-pay | Admitting: Family Medicine

## 2019-02-07 VITALS — BP 122/82 | HR 50 | Temp 97.7°F | Ht 70.0 in | Wt 191.4 lb

## 2019-02-07 DIAGNOSIS — G43909 Migraine, unspecified, not intractable, without status migrainosus: Secondary | ICD-10-CM | POA: Diagnosis not present

## 2019-02-07 DIAGNOSIS — Z0000 Encounter for general adult medical examination without abnormal findings: Secondary | ICD-10-CM

## 2019-02-07 DIAGNOSIS — E785 Hyperlipidemia, unspecified: Secondary | ICD-10-CM

## 2019-02-07 DIAGNOSIS — E663 Overweight: Secondary | ICD-10-CM

## 2019-02-07 LAB — COMPREHENSIVE METABOLIC PANEL
ALT: 24 U/L (ref 0–53)
AST: 19 U/L (ref 0–37)
Albumin: 4.6 g/dL (ref 3.5–5.2)
Alkaline Phosphatase: 66 U/L (ref 39–117)
BUN: 15 mg/dL (ref 6–23)
CO2: 31 mEq/L (ref 19–32)
Calcium: 9.8 mg/dL (ref 8.4–10.5)
Chloride: 102 mEq/L (ref 96–112)
Creatinine, Ser: 0.96 mg/dL (ref 0.40–1.50)
GFR: 91.91 mL/min (ref >60.00)
Glucose, Bld: 85 mg/dL (ref 70–99)
Potassium: 4.4 mEq/L (ref 3.5–5.1)
Sodium: 140 mEq/L (ref 135–145)
Total Bilirubin: 0.7 mg/dL (ref 0.2–1.2)
Total Protein: 6.8 g/dL (ref 6.0–8.3)

## 2019-02-07 LAB — CBC
HCT: 45 % (ref 39.0–52.0)
Hemoglobin: 14.9 g/dL (ref 13.0–17.0)
MCHC: 33.1 g/dL (ref 30.0–36.0)
MCV: 88.2 fl (ref 78.0–100.0)
Platelets: 189 10*3/uL (ref 150.0–400.0)
RBC: 5.1 Mil/uL (ref 4.22–5.81)
RDW: 13.7 % (ref 11.5–15.5)
WBC: 7.3 10*3/uL (ref 4.0–10.5)

## 2019-02-07 LAB — LIPID PANEL
Cholesterol: 162 mg/dL (ref 0–200)
HDL: 49 mg/dL (ref >39.00)
LDL Cholesterol: 92 mg/dL (ref 0–99)
NonHDL: 113.21
Total CHOL/HDL Ratio: 3
Triglycerides: 105 mg/dL (ref 0.0–149.0)
VLDL: 21 mg/dL (ref 0.0–40.0)

## 2019-02-07 NOTE — Patient Instructions (Addendum)
trial thumb spica splint on one hand at a time for 2 weeks -If effective likely an overuse injury -could also try voltaren gel over the counter to see if that helps when its flared up -if not effective- send me a message or call us and we can get you in with Dr. Tamala Julian of sports medicine  Please stop by lab before you go If you do not have mychart- we will call you about results within 5 business days of [place] receiving them.  If you have mychart- we will send your results within 3 business days of [place] receiving them.  If abnormal or we want to clarify a result, we will call or mychart you to make sure you receive the message.  If you have questions or concerns or don't hear within 5-7 days, please send [place] a message or call us.

## 2019-06-11 ENCOUNTER — Encounter: Payer: Self-pay | Admitting: Family Medicine

## 2019-06-14 ENCOUNTER — Ambulatory Visit (INDEPENDENT_AMBULATORY_CARE_PROVIDER_SITE_OTHER): Payer: 59

## 2019-06-14 ENCOUNTER — Encounter: Payer: Self-pay | Admitting: Family Medicine

## 2019-06-14 ENCOUNTER — Other Ambulatory Visit: Payer: Self-pay

## 2019-06-14 ENCOUNTER — Ambulatory Visit (INDEPENDENT_AMBULATORY_CARE_PROVIDER_SITE_OTHER): Payer: 59 | Admitting: Family Medicine

## 2019-06-14 VITALS — BP 140/78 | HR 52 | Ht 70.0 in | Wt 187.4 lb

## 2019-06-14 DIAGNOSIS — M79644 Pain in right finger(s): Secondary | ICD-10-CM

## 2019-06-14 DIAGNOSIS — M79645 Pain in left finger(s): Secondary | ICD-10-CM

## 2019-06-14 DIAGNOSIS — M79641 Pain in right hand: Secondary | ICD-10-CM | POA: Diagnosis not present

## 2019-06-14 NOTE — Patient Instructions (Signed)
Thank you for coming in today. Get labs now.  Attend PT.  Recheck in 1 month.

## 2019-06-14 NOTE — Progress Notes (Signed)
Subjective:    I'm seeing this patient as a consultation for:  Juan Olp, MD   CC: B Thumb pain  I, [name], LAT, ATC, am serving as scribe for Dr. Lynne Leader.  HPI: Has been seen by his PCP previously for thumb pain most recently July 2020. He notes that his B thumb pain has worsened over the past 2 weeks but doesn't recall any specific MOI to flare up his symptoms.  Pt notes the pain in his B thenar eminences that he describes as stiff and crampy.  He rates the pain as a constant 6-7/10.  Aggravating factors include gripping and lifting w/ his hands.  He's tried arthritis cream w/ no relief.  Splints were recommended by his PCP but pt never got these.  Pt notes occasional N/T in his fingertips but not his thumbs.  Past medical history, Surgical history, Family history not pertinant except as noted below, Social history, Allergies, and medications have been entered into the medical record, reviewed, and no changes needed.   Review of Systems: No headache, visual changes, nausea, vomiting, diarrhea, constipation, dizziness, abdominal pain, skin rash, fevers, chills, night sweats, weight loss, swollen lymph nodes, body aches, joint swelling, muscle aches, chest pain, shortness of breath, mood changes, visual or auditory hallucinations.   Objective:    Vitals:   06/14/19 1513  BP: 140/78  Pulse: (!) 52  SpO2: 98%   General: Well Developed, well nourished, and in no acute distress.  Neuro/Psych: Alert and oriented x3, extra-ocular muscles intact, able to move all 4 extremities, sensation grossly intact. Skin: Warm and dry, no rashes noted.  Respiratory: Not using accessory muscles, speaking in full sentences, trachea midline.  Cardiovascular: Pulses palpable, no extremity edema. Abdomen: Does not appear distended. MSK: Hands bilaterally normal-appearing.  No significant synovitis or swelling.  Mildly tender palpation thenar eminence bilateral thumbs.  Negative Tinel's carpal  tunnel.  Normal wrist and thumb motion.   Lab and Radiology Results X-ray images obtained today personally and independently reviewed.  Right hand: Normal-appearing with no significant degenerative changes or fracture or malalignment.  Left hand: Normal-appearing no significant degenerative changes fracture or malalignment.  Await formal radiology review.  Limited musculoskeletal ultrasound right thumb normal-appearing joint structure at [facility].  Normal tendon structures.  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  Bony structures normal.  Left thumb: Normal-appearing CMC.  Normal bony structures.   Impression and Recommendations:    Assessment and Plan: 28 y.o. male with bilateral thumb pain.  Etiology at this time is somewhat unclear.  X-ray to my read looks pretty normal as does ultrasound.  Plan for rheumatologic work-up listed below and referral to physical therapy.  Check back in about a month.   PDMP not reviewed this encounter. Orders Placed This Encounter  Procedures  . DG Hand Complete Right    Standing Status:   Future    Number of Occurrences:   1    Standing Expiration Date:   08/13/2020    Order Specific Question:   Reason for Exam (SYMPTOM  OR DIAGNOSIS REQUIRED)    Answer:   eval pain base of thumb    Order Specific Question:   Preferred imaging location?    Answer:   [facility]    Order Specific Question:   Radiology Contrast Protocol - do NOT remove file path    Answer:   \\[email or web address]\DXFluoroContrastProtocols.pdf  . DG Hand Complete Left    Standing Status:   Future  Number of Occurrences:   1    Standing Expiration Date:   08/13/2020    Order Specific Question:   Reason for Exam (SYMPTOM  OR DIAGNOSIS REQUIRED)    Answer:   eval pain base of thumb    Order Specific Question:   Preferred imaging location?    Answer:   [facility] Question:   Radiology Contrast Protocol - do NOT remove file path     Answer:   \\[email or web address]\Radiant\DXFluoroContrastProtocols.pdf  . [place] - Upper Extremity - Limited - BILATERAL    Order Specific Question:   Reason for Exam (SYMPTOM  OR DIAGNOSIS REQUIRED)    Answer:   B hand pain    Order Specific Question:   Preferred imaging location?    Answer:   [facility]  . CK    Standing Status:   Future    Number of Occurrences:   1    Standing Expiration Date:   06/13/2020  . Sedimentation rate    Standing Status:   Future    Number of Occurrences:   1    Standing Expiration Date:   06/13/2020  . TSH    Standing Status:   Future    Number of Occurrences:   1    Standing Expiration Date:   06/13/2020  . Rheumatoid factor  . ANA w/reflex  . Ambulatory referral to Physical Therapy    Referral Priority:   Routine    Referral Type:   Physical Medicine    Referral Reason:   Specialty Services Required    Requested Specialty:   Physical Therapy   No orders of the defined types were placed in this encounter.   Discussed warning signs or symptoms. Please see discharge instructions. Patient expresses understanding.  The above documentation has been reviewed and is accurate and complete Juan Boone

## 2019-06-15 ENCOUNTER — Encounter: Payer: Self-pay | Admitting: Family Medicine

## 2019-06-15 LAB — SEDIMENTATION RATE: Sed Rate: 1 mm/hr (ref 0–15)

## 2019-06-15 LAB — TSH: TSH: 1.93 u[IU]/mL (ref 0.35–4.50)

## 2019-06-15 LAB — CK: Total CK: 190 U/L (ref 7–232)

## 2019-06-15 NOTE — Progress Notes (Signed)
Xray hand is normal

## 2019-06-15 NOTE — Progress Notes (Signed)
Rheumatoid factor came back negative.  Other labs are still pending.

## 2019-06-16 LAB — ANA: Anti Nuclear Antibody (ANA): NEGATIVE

## 2019-06-16 LAB — RHEUMATOID FACTOR: Rheumatoid fact SerPl-aCnc: <14 IU/mL (ref <14)

## 2019-06-18 NOTE — Progress Notes (Signed)
Labs are reassuringly normal.  X-ray was also normal appearing at this point the next best step is hand therapy/physical therapy.  Let me know if no better.  There are other test to do including a nerve study but next diagnostic tests are more obnoxious or more invasive. Me know if not better with hand therapy/physical therapy.

## 2019-06-19 ENCOUNTER — Other Ambulatory Visit: Payer: Self-pay

## 2019-06-19 ENCOUNTER — Encounter: Payer: Self-pay | Admitting: Physical Therapy

## 2019-06-19 ENCOUNTER — Ambulatory Visit (INDEPENDENT_AMBULATORY_CARE_PROVIDER_SITE_OTHER): Payer: 59 | Admitting: Physical Therapy

## 2019-06-19 DIAGNOSIS — M79642 Pain in left hand: Secondary | ICD-10-CM | POA: Diagnosis not present

## 2019-06-19 DIAGNOSIS — M79641 Pain in right hand: Secondary | ICD-10-CM

## 2019-06-19 NOTE — Therapy (Signed)
[facility] [address] [place], [place], [address] Phone: [phone]   Fax:  [phone]  Physical Therapy Evaluation  Patient Details  Name: Juan Boone MRN: [id number] Date of Birth: 11/07/1990 Referring Provider (PT): Clementeen GrahamEvan Corey   Encounter Date: 06/19/2019  PT End of Session - 06/19/19 2235    Visit Number  1    Number of Visits  12    Date for PT Re-Evaluation  07/31/19    Authorization Type  [facility]    PT Start Time  1217    PT Stop Time  1304    PT Time Calculation (min)  47 min    Activity Tolerance  Patient tolerated treatment well    Behavior During Therapy  [facility] for tasks assessed/performed       Past Medical History:  Diagnosis Date  . ADD (attention deficit disorder)   . Lactose intolerance   . Migraine    when treated for ADD years ago on adderrall- resolved off this  . Vertigo     Past Surgical History:  Procedure Laterality Date  . none      There were no vitals filed for this visit.   Subjective Assessment - 06/19/19 1224    Subjective  Pt states onset of bil thumb pain, worsened a few weeks ago. He states he has had mild pain for about 1 year, but has gotten worse lately. He reports no new activity to start pain, bue has been working out doing push ups. denies numbness /tingling or weakness feeling. States increased pain with gripping, carrying, and has made some modifcations due to pain.    Limitations  Lifting;Writing;House hold activities    Patient Stated Goals  decreased pain    Currently in Pain?  Yes    Pain Score  5     Pain Location  Hand    Pain Orientation  Right;Left    Pain Descriptors / Indicators  Aching    Pain Type  Acute pain    Pain Onset  1 to 4 weeks ago    Pain Frequency  Intermittent    Aggravating Factors   gripping, lifting, squeeze, carry    Pain Relieving Factors  rest         [facility] PT Assessment - 06/19/19 0001      Assessment   Medical Diagnosis  Bil  thumb pain    Referring Provider (PT)  Clementeen GrahamEvan Corey    Hand Dominance  Right    Prior Therapy  no      Balance Screen   Has the patient fallen in the past 6 months  No      Prior Function   Level of Independence  Independent      Cognition   Overall Cognitive Status  Within Functional Limits for tasks assessed      ROM / Strength   AROM / PROM / Strength  AROM;Strength      AROM   Overall AROM Comments  UE: WNL;       Strength   Overall Strength Comments  Wrist: 4/5 bil;  Shoulders : 5/5 bil;     Strength Assessment Site  Hand    Right/Left hand  Right;Left    Right Hand Grip (lbs)  75    Left Hand Grip (lbs)  70      Flexibility   Soft Tissue Assessment /Muscle Length  yes      Palpation   Palpation comment  Mild hypomobility  in Bil wrist ext, mild in flexion, and mild for supination,  Hypermobile L MCP;  Tenderness and trigger points in bil thenar eminence      Special Tests   Other special tests  Neg DeQervains, Neg Tinels,                 Objective measurements completed on examination: See above findings.      [facility] Adult PT Treatment/Exercise - 06/19/19 0001      Exercises   Exercises  Wrist;Hand      Wrist Exercises   Other wrist exercises  Wrist flex and ext stretches 30 sec x2 each bil; Prayer stretch 30 sec x2;        Manual Therapy   Manual Therapy  Soft tissue mobilization;Taping    Soft tissue mobilization  DTM and TPR to bil thenar region     Kinesiotex  Inhibit Muscle      Kinesiotix   Inhibit Muscle   --   2 1 strips for MC and CMC stabilization, bilaterally             PT Education - 06/19/19 2235    Education Details  PT POC, Exam findings, HEP    Person(s) Educated  Patient    Methods  Explanation;Demonstration;Tactile cues;Verbal cues;Handout    Comprehension  Verbalized understanding;Returned demonstration;Verbal cues required;Need further instruction       PT Short Term Goals - 06/19/19 2237      PT SHORT TERM  GOAL #1   Title  Pt to be independent with initial HEP    Time  2    Period  Weeks    Status  New    Target Date  07/03/19        PT Long Term Goals - 06/19/19 2238      PT LONG TERM GOAL #1   Title  Pt to be independent wtih final HEP    Time  6    Period  Weeks    Status  New    Target Date  07/31/19      PT LONG TERM GOAL #2   Title  Pt to report decreased pain in Bil thumbs to 0-2/10 with grip, squeeze, carry, and work duties.    Time  6    Period  Weeks    Status  New    Target Date  07/31/19      PT LONG TERM GOAL #3   Title  Pt to demo improved wrist Extension ROM to be WNL, to improve mechanics with weight bearing activity.    Time  6    Period  Weeks    Status  New    Target Date  07/31/19             Plan - 06/19/19 2241    Clinical Impression Statement  Pt presents with primary complaint of increased pain in Bilateral thumb,thenar eminence. Pt with soreness in musculature with palpation, and increased pain with activity, gripping, lifting, carrying and IADLS. Pt with stiffness with bil wrist Extension, and mild limitation for supination. Pt with decreased ability for full functional activiites, due to pain PT to benefit from skilled PT to improve deficits and pain.    Examination-Activity Limitations  Lift    Examination-Participation Restrictions  Meal Prep;Cleaning;Community Activity    Stability/Clinical Decision Making  Stable/Uncomplicated    Clinical Decision Making  Low    Rehab Potential  Good    PT Frequency  2x / week  PT Duration  6 weeks    PT Treatment/Interventions  ADLs/Self Care Home Management;Cryotherapy;Electrical Stimulation;DME Instruction;Ultrasound;Traction;Moist Heat;Iontophoresis 4mg /ml Dexamethasone;Functional mobility training;Therapeutic activities;Therapeutic exercise;Balance training;Neuromuscular re-education;Manual techniques;Patient/family education;Passive range of motion;Dry needling;Taping;Spinal Manipulations;Joint  Manipulations    Consulted and Agree with Plan of Care  Patient       Patient will benefit from skilled therapeutic intervention in order to improve the following deficits and impairments:  Decreased range of motion, Increased muscle spasms, Decreased activity tolerance, Pain, Impaired flexibility, Decreased strength  Visit Diagnosis: Pain in left hand  Pain in right hand     Problem List Patient Active Problem List   Diagnosis Date Noted  . Gluten intolerance 09/09/2016  . Migraine     Lyndee Hensen, PT, DPT 10:44 PM  06/19/19    [facility] [place], [place], [address] Phone: [phone]   Fax:  [phone]  Name: Avedis Bevis MRN: [id number] Date of Birth: 1990-12-31

## 2019-06-19 NOTE — Patient Instructions (Signed)
Access Code:Access Code: [email or web address]  URL: [email or web address]  Date: 06/19/2019  Prepared by: Lyndee Hensen   Exercises Seated Wrist Flexion with Overpressure - 3 reps - 30 hold - 2x daily Wrist Extension Stretch Pronated - 3 reps - 30 hold - 2x daily Wrist Prayer Stretch - 3 reps - 30 hold - 2x daily

## 2019-06-22 ENCOUNTER — Encounter: Payer: Self-pay | Admitting: Family Medicine

## 2019-06-28 ENCOUNTER — Encounter: Payer: Self-pay | Admitting: Physical Therapy

## 2019-06-28 ENCOUNTER — Other Ambulatory Visit: Payer: Self-pay

## 2019-06-28 ENCOUNTER — Ambulatory Visit (INDEPENDENT_AMBULATORY_CARE_PROVIDER_SITE_OTHER): Payer: 59 | Admitting: Physical Therapy

## 2019-06-28 DIAGNOSIS — M79642 Pain in left hand: Secondary | ICD-10-CM

## 2019-06-28 DIAGNOSIS — M79641 Pain in right hand: Secondary | ICD-10-CM

## 2019-06-28 NOTE — Therapy (Signed)
[facility] [address] [place], [place], [address] Phone: [phone]   Fax:  [phone]  Physical Therapy Treatment  Patient Details  Name: Juan Boone MRN: [id number] Date of Birth: [birth year] Referring Provider (PT): Lynne Leader   Encounter Date: 06/28/2019  PT End of Session - 06/28/19 1343    Visit Number  2    Number of Visits  12    Date for PT Re-Evaluation  07/31/19    Authorization Type  [facility]    PT Start Time  1102    PT Stop Time  1142    PT Time Calculation (min)  40 min    Activity Tolerance  Patient tolerated treatment well    Behavior During Therapy  [facility] for tasks assessed/performed       Past Medical History:  Diagnosis Date  . ADD (attention deficit disorder)   . Lactose intolerance   . Migraine    when treated for ADD years ago on adderrall- resolved off this  . Vertigo     Past Surgical History:  Procedure Laterality Date  . none      There were no vitals filed for this visit.  Subjective Assessment - 06/28/19 1342    Subjective  Pt states soreness in bil thumb region    Currently in Pain?  Yes    Pain Score  5     Pain Location  Hand    Pain Orientation  Right;Left    Pain Descriptors / Indicators  Aching;Tightness    Pain Type  Acute pain    Pain Onset  1 to 4 weeks ago    Pain Frequency  Intermittent                       [facility] Adult PT Treatment/Exercise - 06/28/19 0001      Wrist Exercises   Other wrist exercises  Wrist ext stretch on table ( thumb off table to relax) 20 sec x 3 bil;       Modalities   Modalities  Ultrasound      Ultrasound   Ultrasound Location  bil thenar region    Ultrasound Parameters  1 Mhz, .8 w/cm2    Ultrasound Goals  Pain      Manual Therapy   Manual Therapy  Joint mobilization    Joint Mobilization  wrist jt mobs, to improve extension , bil    Soft tissue mobilization  DTM and TPR to bil thenar region                 PT Short Term Goals - 06/19/19 2237      PT SHORT TERM GOAL #1   Title  Pt to be independent with initial HEP    Time  2    Period  Weeks    Status  New    Target Date  07/03/19        PT Long Term Goals - 06/19/19 2238      PT LONG TERM GOAL #1   Title  Pt to be independent wtih final HEP    Time  6    Period  Weeks    Status  New    Target Date  07/31/19      PT LONG TERM GOAL #2   Title  Pt to report decreased pain in Bil thumbs to 0-2/10 with grip, squeeze, carry, and work duties.    Time  6  Period  Weeks    Status  New    Target Date  07/31/19      PT LONG TERM GOAL #3   Title  Pt to demo improved wrist Extension ROM to be WNL, to improve mechanics with weight bearing activity.    Time  6    Period  Weeks    Status  New    Target Date  07/31/19            Plan - 06/28/19 1345    Clinical Impression Statement  Pt with soreness deep in thenar eminence, in APB and FPB.  DTM and US done today to improve. [place] somewhat sore for pt, despite being on low/pulsed setting, Will try Dn next visit for muscle tightness. Discussed wrist ext stretching without over stretching thumb.    Examination-Activity Limitations  Lift    Examination-Participation Restrictions  Meal Prep;Cleaning;Community Activity    Stability/Clinical Decision Making  Stable/Uncomplicated    Rehab Potential  Good    PT Frequency  2x / week    PT Duration  6 weeks    PT Treatment/Interventions  ADLs/Self Care Home Management;Cryotherapy;Electrical Stimulation;DME Instruction;Ultrasound;Traction;Moist Heat;Iontophoresis 4mg /ml Dexamethasone;Functional mobility training;Therapeutic activities;Therapeutic exercise;Balance training;Neuromuscular re-education;Manual techniques;Patient/family education;Passive range of motion;Dry needling;Taping;Spinal Manipulations;Joint Manipulations    Consulted and Agree with Plan of Care  Patient       Patient will benefit from skilled  therapeutic intervention in order to improve the following deficits and impairments:  Decreased range of motion, Increased muscle spasms, Decreased activity tolerance, Pain, Impaired flexibility, Decreased strength  Visit Diagnosis: Pain in left hand  Pain in right hand     Problem List Patient Active Problem List   Diagnosis Date Noted  . Gluten intolerance 09/09/2016  . Migraine     11/07/2016, PT, DPT 1:47 PM  06/28/19    [name] [address] [place], [place], [place] Phone: [phone]   Fax:  [phone]  Name: Juan Boone MRN: [name] Date of Birth: May 14, 1991

## 2019-07-03 ENCOUNTER — Other Ambulatory Visit: Payer: Self-pay

## 2019-07-04 ENCOUNTER — Ambulatory Visit (INDEPENDENT_AMBULATORY_CARE_PROVIDER_SITE_OTHER): Payer: 59 | Admitting: Physical Therapy

## 2019-07-04 DIAGNOSIS — M79641 Pain in right hand: Secondary | ICD-10-CM

## 2019-07-04 DIAGNOSIS — M79642 Pain in left hand: Secondary | ICD-10-CM

## 2019-07-06 ENCOUNTER — Encounter: Payer: Self-pay | Admitting: Physical Therapy

## 2019-07-06 NOTE — Therapy (Signed)
[facility] [address] [place], [place], [address] Phone: [phone]   Fax:  [phone]  Physical Therapy Treatment  Patient Details  Name: Bryson Gavia MRN: [id number] Date of Birth: 06/23/1991 Referring Provider (PT): Lynne Leader   Encounter Date: 07/04/2019  PT End of Session - 07/06/19 1404    Visit Number  3    Number of Visits  12    Date for PT Re-Evaluation  07/31/19    Authorization Type  [facility]    PT Start Time  1435    PT Stop Time  1510    PT Time Calculation (min)  35 min    Activity Tolerance  Patient tolerated treatment well    Behavior During Therapy  [facility] for tasks assessed/performed       Past Medical History:  Diagnosis Date  . ADD (attention deficit disorder)   . Lactose intolerance   . Migraine    when treated for ADD years ago on adderrall- resolved off this  . Vertigo     Past Surgical History:  Procedure Laterality Date  . none      There were no vitals filed for this visit.  Subjective Assessment - 07/06/19 1403    Subjective  Pt states soreness in bil thumbs. Had mild increase in pain after last session.    Currently in Pain?  Yes    Pain Score  4     Pain Location  Hand    Pain Orientation  Right    Pain Descriptors / Indicators  Aching;Tightness    Pain Type  Acute pain    Pain Onset  1 to 4 weeks ago    Pain Frequency  Intermittent                       [facility] Adult PT Treatment/Exercise - 07/06/19 1407      Wrist Exercises   Other wrist exercises  Wrist ext stretch on table ( thumb off table to relax) 20 sec x 3 bil;     Other wrist exercises  Rows GTBx20;  Bicep curl 4lb bil x20.       Modalities   Modalities  --      Ultrasound   Ultrasound Goals  --      Manual Therapy   Manual Therapy  Joint mobilization    Manual therapy comments  skilled palpation and monitoring of soft tissue during dry needling.     Joint Mobilization  wrist jt mobs, to improve  extension , bil    Soft tissue mobilization  DTM and TPR to bil thenar region        Trigger Point Dry Needling - 07/06/19 0001    Consent Given?  Yes    Education Handout Provided  Yes    Muscles Treated Wrist/Hand  Opponens pollicis;Flexor pollicis brevis    Opponens pollicis Response  Palpable increased muscle length    Flexor pollicis brevis Response  Palpable increased muscle length             PT Short Term Goals - 07/06/19 1406      PT SHORT TERM GOAL #1   Title  Pt to be independent with initial HEP    Time  2    Period  Weeks    Status  Achieved    Target Date  07/03/19        PT Long Term Goals - 06/19/19 2238  PT LONG TERM GOAL #1   Title  Pt to be independent wtih final HEP    Time  6    Period  Weeks    Status  New    Target Date  07/31/19      PT LONG TERM GOAL #2   Title  Pt to report decreased pain in Bil thumbs to 0-2/10 with grip, squeeze, carry, and work duties.    Time  6    Period  Weeks    Status  New    Target Date  07/31/19      PT LONG TERM GOAL #3   Title  Pt to demo improved wrist Extension ROM to be WNL, to improve mechanics with weight bearing activity.    Time  6    Period  Weeks    Status  New    Target Date  07/31/19            Plan - 07/06/19 1406    Clinical Impression Statement  DN done today for muscle pain, will assess pain relief next visit. Pt continues to have most discomfort in thenar musculature. Discussed continuting wrist ROM and stretching, to improve Extension    Examination-Activity Limitations  Lift    Examination-Participation Restrictions  Meal Prep;Cleaning;Community Activity    Stability/Clinical Decision Making  Stable/Uncomplicated    Rehab Potential  Good    PT Frequency  2x / week    PT Duration  6 weeks    PT Treatment/Interventions  ADLs/Self Care Home Management;Cryotherapy;Electrical Stimulation;DME Instruction;Ultrasound;Traction;Moist Heat;Iontophoresis 4mg /ml  Dexamethasone;Functional mobility training;Therapeutic activities;Therapeutic exercise;Balance training;Neuromuscular re-education;Manual techniques;Patient/family education;Passive range of motion;Dry needling;Taping;Spinal Manipulations;Joint Manipulations    Consulted and Agree with Plan of Care  Patient       Patient will benefit from skilled therapeutic intervention in order to improve the following deficits and impairments:  Decreased range of motion, Increased muscle spasms, Decreased activity tolerance, Pain, Impaired flexibility, Decreased strength  Visit Diagnosis: Pain in left hand  Pain in right hand     Problem List Patient Active Problem List   Diagnosis Date Noted  . Gluten intolerance 09/09/2016  . Migraine      11/07/2016, PT, DPT 2:10 PM  07/06/19   [facility] [address] [place], [place], [place] Phone: [phone]   Fax:  [phone]  Name: Jayel Inks MRN: [name] Date of Birth: 24-Apr-1991

## 2019-07-11 ENCOUNTER — Other Ambulatory Visit: Payer: Self-pay

## 2019-07-12 ENCOUNTER — Ambulatory Visit (INDEPENDENT_AMBULATORY_CARE_PROVIDER_SITE_OTHER): Payer: 59 | Admitting: Physical Therapy

## 2019-07-12 ENCOUNTER — Encounter: Payer: Self-pay | Admitting: Family Medicine

## 2019-07-12 ENCOUNTER — Ambulatory Visit (INDEPENDENT_AMBULATORY_CARE_PROVIDER_SITE_OTHER): Payer: 59 | Admitting: Family Medicine

## 2019-07-12 ENCOUNTER — Encounter: Payer: Self-pay | Admitting: Physical Therapy

## 2019-07-12 VITALS — BP 140/96 | HR 55 | Ht 70.0 in | Wt 186.8 lb

## 2019-07-12 DIAGNOSIS — M79645 Pain in left finger(s): Secondary | ICD-10-CM | POA: Diagnosis not present

## 2019-07-12 DIAGNOSIS — M79642 Pain in left hand: Secondary | ICD-10-CM

## 2019-07-12 DIAGNOSIS — M79641 Pain in right hand: Secondary | ICD-10-CM

## 2019-07-12 DIAGNOSIS — M79644 Pain in right finger(s): Secondary | ICD-10-CM | POA: Diagnosis not present

## 2019-07-12 NOTE — Patient Instructions (Signed)
Thank you for coming in today. We will proceed with the nerve test.  You should hear soon about scheduling.  Let me know if you have trouble getting an appointment or never get called.  We can also consider medicine for symptom control including  Gabapentin, Lyrica, Cymbalta.   Keep me updated.  Lets plan to check back about 1 week after the nerve test.

## 2019-07-12 NOTE — Therapy (Signed)
[facility] [address] [place], [place], [address] Phone: [phone]   Fax:  [phone]  Physical Therapy Treatment  Patient Details  Name: Juan Boone MRN: [id number] Date of Birth: 02/14/1991 Referring Provider (PT): Lynne Leader   Encounter Date: 07/12/2019  PT End of Session - 07/12/19 1603    Visit Number  4    Number of Visits  12    Date for PT Re-Evaluation  07/31/19    Authorization Type  [facility]    PT Start Time  1522    PT Stop Time  1554    PT Time Calculation (min)  32 min    Activity Tolerance  Patient tolerated treatment well    Behavior During Therapy  [facility] for tasks assessed/performed       Past Medical History:  Diagnosis Date  . ADD (attention deficit disorder)   . Lactose intolerance   . Migraine    when treated for ADD years ago on adderrall- resolved off this  . Vertigo     Past Surgical History:  Procedure Laterality Date  . none      There were no vitals filed for this visit.  Subjective Assessment - 07/12/19 1600    Subjective  Pt states no relief in pain.    Currently in Pain?  Yes    Pain Score  5     Pain Location  Hand    Pain Orientation  Right;Left    Pain Descriptors / Indicators  Aching;Tightness    Pain Type  Acute pain    Pain Onset  1 to 4 weeks ago    Pain Frequency  Intermittent                       [facility] Adult PT Treatment/Exercise - 07/12/19 1527      Wrist Exercises   Other wrist exercises  Wrist ext stretch on table ( thumb off table to relax) 20 sec x 3 bil;     Other wrist exercises  --      Manual Therapy   Manual Therapy  Joint mobilization    Manual therapy comments  skilled palpation and monitoring of soft tissue during dry needling.     Joint Mobilization  wrist jt mobs, to improve extension , bil    Soft tissue mobilization  DTM and TPR to bil thenar region        Trigger Point Dry Needling - 07/12/19 0001    Consent Given?  Yes    Education Handout Provided  Previously provided    Muscles Treated Wrist/Hand  Opponens pollicis;Flexor pollicis brevis    Opponens pollicis Response  Palpable increased muscle length   L   Flexor pollicis brevis Response  Palpable increased muscle length   R            PT Short Term Goals - 07/06/19 1406      PT SHORT TERM GOAL #1   Title  Pt to be independent with initial HEP    Time  2    Period  Weeks    Status  Achieved    Target Date  07/03/19        PT Long Term Goals - 06/19/19 2238      PT LONG TERM GOAL #1   Title  Pt to be independent wtih final HEP    Time  6    Period  Weeks    Status  New  Target Date  07/31/19      PT LONG TERM GOAL #2   Title  Pt to report decreased pain in Bil thumbs to 0-2/10 with grip, squeeze, carry, and work duties.    Time  6    Period  Weeks    Status  New    Target Date  07/31/19      PT LONG TERM GOAL #3   Title  Pt to demo improved wrist Extension ROM to be WNL, to improve mechanics with weight bearing activity.    Time  6    Period  Weeks    Status  New    Target Date  07/31/19            Plan - 07/12/19 1606    Clinical Impression Statement  Pt continues to have pain in bil thumbs. Focus on DN and manual today for muscle pain. Pt with MD f/u today, will hold PT until after appt.    Examination-Activity Limitations  Lift    Examination-Participation Restrictions  Meal Prep;Cleaning;Community Activity    Stability/Clinical Decision Making  Stable/Uncomplicated    Rehab Potential  Good    PT Frequency  2x / week    PT Duration  6 weeks    PT Treatment/Interventions  ADLs/Self Care Home Management;Cryotherapy;Electrical Stimulation;DME Instruction;Ultrasound;Traction;Moist Heat;Iontophoresis 4mg /ml Dexamethasone;Functional mobility training;Therapeutic activities;Therapeutic exercise;Balance training;Neuromuscular re-education;Manual techniques;Patient/family education;Passive range of motion;Dry  needling;Taping;Spinal Manipulations;Joint Manipulations    Consulted and Agree with Plan of Care  Patient       Patient will benefit from skilled therapeutic intervention in order to improve the following deficits and impairments:  Decreased range of motion, Increased muscle spasms, Decreased activity tolerance, Pain, Impaired flexibility, Decreased strength  Visit Diagnosis: Pain in left hand  Pain in right hand     Problem List Patient Active Problem List   Diagnosis Date Noted  . Gluten intolerance 09/09/2016  . Migraine     11/07/2016, PT, DPT 4:50 PM  07/12/19    [facility] [address] [place], [place], [place] Phone: [phone]   Fax:  [phone]  Name: Juan Boone MRN: [name] Date of Birth: Apr 20, 1991

## 2019-07-12 NOTE — Progress Notes (Signed)
I, [name], LAT, ATC, am serving as scribe for Dr. Lynne Leader.  Juan Boone is a 28 y.o. male who presents to [place] today for f/u of his B thumb pain.  He was last seen by Dr. Georgina Snell on 06/14/19 when he reported fairly constant 6-7/10 stiff and crampy-type pain at the base of his B thumbs.  He had B hand XRs and blood work on 06/14/19 and was referred to PT.  Since his last visit, the pt reports that his thumbs are getting worse (L>R), rating his pain as a 7-8/10 at it's worst.  He reports that his pain intensity and frequency have both worsened and is noticing some sensitivity to cold.  He reports no sleep disturbance.      ROS:  As above  Exam:  BP (!) 140/96 (BP Location: Left Arm, Patient Position: Sitting, Cuff Size: Normal)   Pulse (!) 55   Ht 5\' 10"  (1.778 m)   Wt 186 lb 12.8 oz (84.7 kg)   SpO2 99%   BMI 26.80 kg/m  Wt Readings from Last 5 Encounters:  07/12/19 186 lb 12.8 oz (84.7 kg)  06/14/19 187 lb 6.4 oz (85 kg)  02/07/19 191 lb 6.4 oz (86.8 kg)  02/03/18 225 lb (102.1 kg)  12/16/17 221 lb (100.2 kg)   General: Well Developed, well nourished, and in no acute distress.  Neuro/Psych: Alert and oriented x3, extra-ocular muscles intact, able to move all 4 extremities, sensation grossly intact. Skin: Warm and dry, no rashes noted.  Respiratory: Not using accessory muscles, speaking in full sentences, trachea midline.  Cardiovascular: Pulses palpable, no extremity edema. Abdomen: Does not appear distended. MSK: Hands normal-appearing bilaterally.  Normal hand and thumb motion.  Pulses cap refill and sensation are intact distally.    Lab and Radiology Results [place] - Upper Extremity - Limited - Bilateral  Result Date: 06/18/2019 Limited musculoskeletal ultrasound right thumb normal-appearing joint structure at [facility].  Normal tendon structures.  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  Bony structures normal.  Left thumb: Normal-appearing CMC.   Normal bony structures  Dg Hand Complete Left  Result Date: 06/15/2019 CLINICAL DATA:  Base of thumb pain. EXAM: LEFT HAND - COMPLETE 3+ VIEW COMPARISON:  None. FINDINGS: No acute or degenerative findings. IMPRESSION: Negative. Electronically Signed   By: Lorin Picket M.D.   On: 06/15/2019 08:17   Dg Hand Complete Right  Result Date: 06/15/2019 CLINICAL DATA:  Bilateral thumb pain, base of thumb pain. EXAM: RIGHT HAND - COMPLETE 3+ VIEW COMPARISON:  None. FINDINGS: No acute or degenerative findings. IMPRESSION: Negative. Electronically Signed   By: Lorin Picket M.D.   On: 06/15/2019 08:17   I, Lynne Leader, personally (independently) visualized and performed the interpretation of the images attached in this note.  Recent Results (from the past 2160 hour(s))  TSH     Status: None   Collection Time: 06/14/19  4:17 PM  Result Value Ref Range   TSH 1.93 0.35 - 4.50 uIU/mL  Sedimentation rate     Status: None   Collection Time: 06/14/19  4:17 PM  Result Value Ref Range   Sed Rate 1 0 - 15 mm/hr  CK     Status: None   Collection Time: 06/14/19  4:17 PM  Result Value Ref Range   Total CK 190 7 - 232 U/L  Rheumatoid factor     Status: None   Collection Time: 06/14/19  4:30 PM  Result Value Ref Range  Rhuematoid fact SerPl-aCnc <14 <14 IU/mL  ANA w/reflex     Status: None   Collection Time: 06/14/19  4:30 PM  Result Value Ref Range   Anti Nuclear Antibody (ANA) NEGATIVE NEGATIVE    Comment: ANA IFA is a first line screen for detecting the presence of up to approximately 150 autoantibodies in various autoimmune diseases. A negative ANA IFA result suggests an ANA-associated autoimmune disease is not present at this time, but is not definitive. If there is high clinical suspicion for Sjogren's syndrome, testing for anti-SS-A/Ro antibody should be considered. Anti-Jo-1 antibody should be considered for clinically suspected inflammatory myopathies. . AC-0: Negative . International  Consensus on ANA Patterns (SeverTies.uyhttps://doi.org/10.1515/cclm-2018-0052) . For additional information, please refer to http://education.[facility].com/faq/FAQ177 (This link is being provided for informational/ educational purposes only.) .        Assessment and Plan: 28 y.o. male with  Pain thenar eminence bilaterally.  Etiology at this time is unclear.  Patient has normal-appearing x-ray as well as rheumatologic work-up.  He has had trial of 4 physical therapy sessions with worsening symptoms.  Plan to proceed with further evaluation.  We will proceed with nerve conduction study EMG.  Check back after EMG completed.  Also discussed symptom management possibility including gabapentin Lyrica Cymbalta amitriptyline and nortriptyline.  Patient elects to hold off of these medications for now.   PDMP not reviewed this encounter. Orders Placed This Encounter  Procedures  . NCV with EMG(electromyography)    Standing Status:   Future    Standing Expiration Date:   07/11/2020    Order Specific Question:   Where should this test be performed?    Answer:   [facility]   No orders of the defined types were placed in this encounter.   Historical information moved to improve visibility of documentation.  Past Medical History:  Diagnosis Date  . ADD (attention deficit disorder)   . Lactose intolerance   . Migraine    when treated for ADD years ago on adderrall- resolved off this  . Vertigo    Past Surgical History:  Procedure Laterality Date  . none     Social History   Tobacco Use  . Smoking status: Never Smoker  . Smokeless tobacco: Never Used  Substance Use Topics  . Alcohol use: Yes    Comment: occasional about 1 a week   family history includes Crohn's disease in an other family member. He was adopted.  Medications: Current Outpatient Medications  Medication Sig Dispense Refill  . B Complex Vitamins (B COMPLEX PO) Take by mouth.    Marland Kitchen. BIOTIN PO Take by mouth.    Marland Kitchen. LACTASE  ENZYME PO Take 1 packet by mouth 3 (three) times daily with meals as needed (lactose intolerance).    . Probiotic Product (PROBIOTIC PO) Take by mouth.    Marland Kitchen. omeprazole (PRILOSEC) 40 MG capsule Take 1 capsule (40 mg total) by mouth daily. (Patient not taking: Reported on 06/14/2019) 30 capsule 11   Current Facility-Administered Medications  Medication Dose Route Frequency Provider Last Rate Last Dose  . 0.9 %  sodium chloride infusion  500 mL Intravenous Once Hilarie FredricksonPerry, John N, MD       Allergies  Allergen Reactions  . Gluten Meal Other (See Comments)    Gi upset  . Lactose Intolerance (Gi) Diarrhea and Other (See Comments)    Gi upset  . Aleve [Naproxen Sodium] Other (See Comments)    GI upset      Discussed warning signs or  symptoms. Please see discharge instructions. Patient expresses understanding.  The above documentation has been reviewed and is accurate and complete Clementeen Graham

## 2019-07-27 ENCOUNTER — Encounter: Payer: Self-pay | Admitting: Family Medicine

## 2019-07-27 ENCOUNTER — Other Ambulatory Visit: Payer: Self-pay | Admitting: Physical Therapy

## 2019-07-27 DIAGNOSIS — M79644 Pain in right finger(s): Secondary | ICD-10-CM

## 2019-07-27 DIAGNOSIS — M79641 Pain in right hand: Secondary | ICD-10-CM

## 2019-07-27 DIAGNOSIS — M79642 Pain in left hand: Secondary | ICD-10-CM

## 2019-07-30 ENCOUNTER — Other Ambulatory Visit: Payer: Self-pay

## 2019-07-30 DIAGNOSIS — G8929 Other chronic pain: Secondary | ICD-10-CM

## 2019-07-30 DIAGNOSIS — M79641 Pain in right hand: Secondary | ICD-10-CM

## 2019-07-30 DIAGNOSIS — M79642 Pain in left hand: Secondary | ICD-10-CM

## 2019-08-14 ENCOUNTER — Other Ambulatory Visit: Payer: Self-pay

## 2019-08-15 ENCOUNTER — Ambulatory Visit (INDEPENDENT_AMBULATORY_CARE_PROVIDER_SITE_OTHER): Payer: 59 | Admitting: Neurology

## 2019-08-15 ENCOUNTER — Other Ambulatory Visit: Payer: Self-pay

## 2019-08-15 DIAGNOSIS — M79642 Pain in left hand: Secondary | ICD-10-CM

## 2019-08-15 DIAGNOSIS — M79644 Pain in right finger(s): Secondary | ICD-10-CM

## 2019-08-15 DIAGNOSIS — M79641 Pain in right hand: Secondary | ICD-10-CM

## 2019-08-15 DIAGNOSIS — G8929 Other chronic pain: Secondary | ICD-10-CM

## 2019-08-15 DIAGNOSIS — M79645 Pain in left finger(s): Secondary | ICD-10-CM

## 2019-08-15 NOTE — Procedures (Signed)
[facility]  [place], [address]  [place], [place] [address] Tel: [phone] Fax:  [phone] Test Date:  08/15/2019  Patient: Juan Boone DOB: 06-14-1991 Physician: Narda Amber, DO  Sex: Male Height: 5\' 10"  Ref Phys: Lynne Leader, MD  ID#: [id number] Temp: 32.0C Technician:    Patient Complaints: This is a 29 year old man referred for evaluation of bilateral thumb and hand pain.  NCV & EMG Findings: Extensive electrodiagnostic testing of the right upper extremity and additional studies of the left shows:  1. Bilateral median, ulnar, radial, and mixed palmar sensory responses are within normal limits. 2. Bilateral median and ulnar motor responses are within normal limits. 3. There is no evidence of active or chronic motor axonal loss changes affecting any of the tested muscles.  Motor unit configuration and recruitment pattern is within normal limits.  Impression: This is a normal study of the upper extremities.  In particular, there is no evidence of carpal tunnel syndrome or cervical radiculopathy affecting either upper extremity.   ___________________________ Narda Amber, DO    Nerve Conduction Studies Anti Sensory Summary Table   Site NR Peak (ms) Norm Peak (ms) P-T Amp (V) Norm P-T Amp  Left Median Anti Sensory (2nd Digit)  32C  Wrist    3.0 <3.4 42.5 >20  Right Median Anti Sensory (2nd Digit)  32C  Wrist    2.8 <3.4 44.4 >20  Left Radial Anti Sensory (Base 1st Digit)  32C  Wrist    2.3 <2.7 41.6 >18  Right Radial Anti Sensory (Base 1st Digit)  32C  Wrist    1.9 <2.7 46.8 >18  Left Ulnar Anti Sensory (5th Digit)  32C  Wrist    2.8 <3.1 37.1 >12  Right Ulnar Anti Sensory (5th Digit)  32C  Wrist    2.5 <3.1 34.6 >12   Motor Summary Table   Site NR Onset (ms) Norm Onset (ms) O-P Amp (mV) Norm O-P Amp Site1 Site2 Delta-0 (ms) Dist (cm) Vel (m/s) Norm Vel (m/s)  Left Median Motor (Abd Poll Brev)  32C  Wrist    3.3 <3.9 11.1 >6 Elbow  Wrist 5.4 31.0 57 >50  Elbow    8.7  11.0         Right Median Motor (Abd Poll Brev)  32C  Wrist    2.7 <3.9 13.6 >6 Elbow Wrist 5.6 31.0 55 >50  Elbow    8.3  13.4         Left Ulnar Motor (Abd Dig Minimi)  32C  Wrist    2.7 <3.1 13.3 >7 B Elbow Wrist 4.3 25.0 58 >50  B Elbow    7.0  12.8  A Elbow B Elbow 1.8 10.0 56 >50  A Elbow    8.8  12.8         Right Ulnar Motor (Abd Dig Minimi)  32C  Wrist    2.7 <3.1 15.2 >7 B Elbow Wrist 4.2 24.0 57 >50  B Elbow    6.9  15.0  A Elbow B Elbow 1.8 10.0 56 >50  A Elbow    8.7  14.8          Comparison Summary Table   Site NR Peak (ms) Norm Peak (ms) P-T Amp (V) Site1 Site2 Delta-P (ms) Norm Delta (ms)  Left Median/Ulnar Palm Comparison (Wrist - 8cm)  32C  Median Palm    1.8 <2.2 71.4 Median Palm Ulnar Palm 0.2   Ulnar Palm    1.6 <  2.2 33.6      Right Median/Ulnar Palm Comparison (Wrist - 8cm)  32C  Median Palm    1.6 <2.2 53.6 Median Palm Ulnar Palm 0.0   Ulnar Palm    1.6 <2.2 27.5       EMG   Side Muscle Ins Act Fibs Psw Fasc Number Recrt Dur Dur. Amp Amp. Poly Poly. Comment  Right 1stDorInt Nml Nml Nml Nml Nml Nml Nml Nml Nml Nml Nml Nml N/A  Right Abd Poll Brev Nml Nml Nml Nml Nml Nml Nml Nml Nml Nml Nml Nml N/A  Right PronatorTeres Nml Nml Nml Nml Nml Nml Nml Nml Nml Nml Nml Nml N/A  Right Biceps Nml Nml Nml Nml Nml Nml Nml Nml Nml Nml Nml Nml N/A  Right Triceps Nml Nml Nml Nml Nml Nml Nml Nml Nml Nml Nml Nml N/A  Left 1stDorInt Nml Nml Nml Nml Nml Nml Nml Nml Nml Nml Nml Nml N/A  Left Abd Poll Brev Nml Nml Nml Nml Nml Nml Nml Nml Nml Nml Nml Nml N/A  Left PronatorTeres Nml Nml Nml Nml Nml Nml Nml Nml Nml Nml Nml Nml N/A  Left Biceps Nml Nml Nml Nml Nml Nml Nml Nml Nml Nml Nml Nml N/A  Left Triceps Nml Nml Nml Nml Nml Nml Nml Nml Nml Nml Nml Nml N/A      Waveforms:

## 2019-08-16 NOTE — Progress Notes (Signed)
Nerve conduction study is normal.  Nerve conduction studies can sometimes have false negatives meaning failed to show abnormality when in fact there really is something wrong.  At this point is not clear to me what the main cause of your pain is.  There are further tests to do if needed however.  Happy to follow-up in the near future.  Recommend schedule in person visit.

## 2019-08-28 ENCOUNTER — Encounter: Payer: Self-pay | Admitting: Family Medicine

## 2019-08-30 ENCOUNTER — Ambulatory Visit: Payer: 59 | Admitting: Psychology

## 2019-09-28 ENCOUNTER — Encounter: Payer: Self-pay | Admitting: Family Medicine

## 2019-09-28 DIAGNOSIS — F909 Attention-deficit hyperactivity disorder, unspecified type: Secondary | ICD-10-CM

## 2019-10-01 ENCOUNTER — Telehealth: Payer: Self-pay | Admitting: Family Medicine

## 2019-10-01 NOTE — Telephone Encounter (Signed)
Patient is requesting to be worked in sooner than next available on April 5th.  States he has seen Dr. Denyse Amass (Sports Med) for his thumb but his thumb pain is getting worse.  Patient would like to know what Dr. Durene Cal would suggest next steps to be.   He also would like to discuss treatment for ADD.   Please follow up with patient in regard.

## 2019-10-01 NOTE — Telephone Encounter (Signed)
Can you place pt in the SDA slot for [facility] 07/02/20 at 11:20a please? He is also COVID positive so he will need to be seen this week, virtually.

## 2019-10-01 NOTE — Telephone Encounter (Signed)
Patient scheduled for 3/1 (Doxy) as other slot had been taken.

## 2019-10-08 ENCOUNTER — Encounter: Payer: Self-pay | Admitting: Family Medicine

## 2019-10-08 ENCOUNTER — Ambulatory Visit (INDEPENDENT_AMBULATORY_CARE_PROVIDER_SITE_OTHER): Payer: 59 | Admitting: Family Medicine

## 2019-10-08 VITALS — Ht 70.0 in | Wt 181.0 lb

## 2019-10-08 DIAGNOSIS — F988 Other specified behavioral and emotional disorders with onset usually occurring in childhood and adolescence: Secondary | ICD-10-CM | POA: Diagnosis not present

## 2019-10-08 DIAGNOSIS — M79641 Pain in right hand: Secondary | ICD-10-CM | POA: Diagnosis not present

## 2019-10-08 DIAGNOSIS — M79642 Pain in left hand: Secondary | ICD-10-CM | POA: Diagnosis not present

## 2019-10-08 MED ORDER — BUPROPION HCL ER (XL) 150 MG PO TB24: 150.0000 mg | ORAL_TABLET | Freq: Every day | ORAL | 5 refills | Status: DC

## 2019-10-08 NOTE — Progress Notes (Signed)
Phone [phone] Virtual visit via Video note   Subjective:  Chief complaint: Chief Complaint  Patient presents with  . Hand Pain  . ADD   This visit type was conducted due to national recommendations for restrictions regarding the COVID-19 Pandemic (e.g. social distancing).  This format is felt to be most appropriate for this patient at this time balancing risks to patient and risks to population by having him in for in person visit.  No physical exam was performed (except for noted visual exam or audio findings with Telehealth visits).    Our team/I connected with Ihor Gully at  3:40 PM EST by a video enabled telemedicine application (doxy.me or caregility through epic) and verified that I am speaking with the correct person using two identifiers.  Location patient: Home-[place] Location provider: [facility], office Persons participating in the virtual visit:  patient  Our team/I discussed the limitations of evaluation and management by telemedicine and the availability of in person appointments. In light of current covid-19 pandemic, patient also understands that we are trying to protect them by minimizing in office contact if at all possible.  The patient expressed consent for telemedicine visit and agreed to proceed. Patient understands insurance will be billed.   Past Medical History-  Patient Active Problem List   Diagnosis Date Noted  . Attention deficit disorder (ADD) in adult     Priority: High  . Migraine     Priority: Medium  . Gluten intolerance 09/09/2016    Priority: Low    Medications- reviewed and updated Current Outpatient Medications  Medication Sig Dispense Refill  . amphetamine-dextroamphetamine (ADDERALL XR) 30 MG 24 hr capsule Take 30 mg by mouth daily.    . B Complex Vitamins (B COMPLEX PO) Take by mouth.    Marland Kitchen BIOTIN PO Take by mouth.    Marland Kitchen LACTASE ENZYME PO Take 1 packet by mouth 3 (three) times daily with meals as needed (lactose intolerance).    Marland Kitchen  omeprazole (PRILOSEC) 40 MG capsule Take 1 capsule (40 mg total) by mouth daily. 30 capsule 11  . Probiotic Product (PROBIOTIC PO) Take by mouth.    Marland Kitchen buPROPion (WELLBUTRIN XL) 150 MG 24 hr tablet Take 1 tablet (150 mg total) by mouth daily. 30 tablet 5   Current Facility-Administered Medications  Medication Dose Route Frequency Provider Last Rate Last Admin  . 0.9 %  sodium chloride infusion  500 mL Intravenous Once Irene Shipper, MD         Objective:  Ht 5' 10"  (1.778 m)   Wt 181 lb (82.1 kg)   BMI 25.97 kg/m  self reported vitals Gen: NAD, resting comfortably Lungs: nonlabored, normal respiratory rate  Skin: appears dry, no obvious rash     Assessment and Plan    #Social update- he was out of work for about 5 months. 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   Thumb Pain  S: pt c/o bilateral  Pain at thenar eminence constant sharp/achy that has gotten worse.   Thumbs have worsened - had met with Dr. Georgina Snell- since January have worsened. PT has not helped. EMG was reassuring. Hurts even to hold his cell phone. Video games 10 mins had to stop. Resting 3-5/10 pain . With pressure moderate to severe.  A/P: Unclear cause of pain in bilateral thenar eminences but this appears to be rather disabling-patient requests the input of a hand specialist-I believe this is the  right next step and I went ahead and referred him today  #Adult ADD S:diagnosed 3rd grade. Started on ritalin but only lasted half a day. Has been on adderrall various doses until college - started getting migraines when he would take- migraines stopped when he stopped  it.  adderral- not depressed but almost zombieish sensation as well  From December until now has made a few stupid mistakes at work with attention issues- has gotten some written feedback  He is not sure if its organization related or if needs to go back on medicines. Denies depression but does not feel  like at his full capacity to do work. Worse with anxiety. Family also states they have noted issues on the homefront  Prior cared for by Dr. Earley Favor- primary care and [facility] doctor most recently- he would be willing to sign for ROI.  A/P: 29 year old male with history of ADD currently untreated and with poor control.  He has had worsening migraines on Adderall in the past.  Did not have significant response this for the duration with Ritalin.  We opted to trial Wellbutrin 150 mg extended release while trying to get records-patient has someone close to him who has taken the Wellbutrin extended release twice a day and had really good benefit-could consider but will start with current dose -We will reach out to Dr. Glennon Hamilton to see if she has any behavioral techniques we can use for patient which he also requests -Asked him to update me in about 2 weeks with how he is doing-may also see some improvement in mood with mild PHQ 9 elevation of 3 -Consider twice daily dosing if needed at that time as long as does not affect sleep  Recommended follow up: 2-week update by MyChart Future Appointments  Date Time Provider Lee  10/19/2019  8:30 AM Leandrew Koyanagi, MD [facility] None    Lab/Order associations:   ICD-10-CM   1. Bilateral hand pain  M79.641 Ambulatory referral to Hand Surgery   [phone]     Meds ordered this encounter  Medications  . buPROPion (WELLBUTRIN XL) 150 MG 24 hr tablet    Sig: Take 1 tablet (150 mg total) by mouth daily.    Dispense:  30 tablet    Refill:  5    Time Spent: 31 minutes of total time (4:18 PM- 4:43 PM, 8:50-8:56 PM) was spent on the date of the encounter performing the following actions: chart review prior to seeing the patient, obtaining history, performing a medically necessary exam, counseling on the treatment plan, placing orders, and documenting in our EHR.   Return precautions advised.  Garret Reddish, MD

## 2019-10-08 NOTE — Patient Instructions (Signed)
There are no preventive care reminders to display for this patient. Depression screen [facility] 2/9 02/07/2019  Decreased Interest 0  Down, Depressed, Hopeless 0  PHQ - 2 Score 0  Altered sleeping 0  Tired, decreased energy 0  Change in appetite 0  Feeling bad or failure about yourself  0  Trouble concentrating 0  Moving slowly or fidgety/restless 0  Suicidal thoughts 0  PHQ-9 Score 0  Difficult doing work/chores Not difficult at all

## 2019-10-19 ENCOUNTER — Encounter: Payer: Self-pay | Admitting: Orthopaedic Surgery

## 2019-10-19 ENCOUNTER — Ambulatory Visit (INDEPENDENT_AMBULATORY_CARE_PROVIDER_SITE_OTHER): Payer: 59 | Admitting: Orthopaedic Surgery

## 2019-10-19 ENCOUNTER — Other Ambulatory Visit: Payer: Self-pay

## 2019-10-19 DIAGNOSIS — M79642 Pain in left hand: Secondary | ICD-10-CM | POA: Diagnosis not present

## 2019-10-19 DIAGNOSIS — M79641 Pain in right hand: Secondary | ICD-10-CM | POA: Diagnosis not present

## 2019-10-19 MED ORDER — DICLOFENAC SODIUM 75 MG PO TBEC: 75.0000 mg | DELAYED_RELEASE_TABLET | Freq: Two times a day (BID) | ORAL | 2 refills | Status: DC

## 2019-10-19 NOTE — Progress Notes (Signed)
Office Visit Note   Patient: Juan Boone           Date of Birth: 1991-07-20           MRN: [id number] Visit Date: 10/19/2019              Requested by: Shelva Majestic, MD [address] [place],  [place] [address] PCP: Shelva Majestic, MD   Assessment & Plan: Visit Diagnoses:  1. Pain in both hands     Plan: Impression is bilateral hand pain.  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  No degenerative changes at this point.  I provided him with a sample of Pennsaid and a prescription for oral diclofenac to take regularly for 2 weeks and then as needed.  He already has had lab work to rule out inflammatory arthritis.  His nerve conduction studies were negative for carpal tunnel syndrome.  Follow-up as needed.  Follow-Up Instructions: Return if symptoms worsen or fail to improve.   Orders:  No orders of the defined types were placed in this encounter.  Meds ordered this encounter  Medications  . diclofenac (VOLTAREN) 75 MG EC tablet    Sig: Take 1 tablet (75 mg total) by mouth 2 (two) times daily.    Dispense:  30 tablet    Refill:  2      Procedures: No procedures performed   Clinical Data: No additional findings.   Subjective: Chief Complaint  Patient presents with  . Right Hand - Pain  . Left Hand - Pain    [name] is a 29 year old gentleman comes in for second opinion for evaluation of bilateral hand pain.  He states that occasionally he has numbness in his fingertips but he does have constant pain as well worse with use and with typing.  He states that the pain is mainly localized to the base of the thumb and around the thenar region.  Hurts to hold the steering wheel.  He feels like a cramping pain.  He states that in the last 6 months this has progressively gotten worse.  Over-the-counter NSAIDs have not helped.  Denies history of gout.  He is not sure of any family history of autoimmune  arthritis because he is adopted.  The pain is worse with opening jars.  Denies any triggering or catching of the fingers.  Denies any swelling of the joints.   Review of Systems  Constitutional: Negative.   All other systems reviewed and are negative.    Objective: Vital Signs: There were no vitals taken for this visit.  Physical Exam Vitals and nursing note reviewed.  Constitutional:      Appearance: He is well-developed.  HENT:     Head: Normocephalic and atraumatic.  Eyes:     Pupils: Pupils are equal, round, and reactive to light.  Pulmonary:     Effort: Pulmonary effort is normal.  Abdominal:     Palpations: Abdomen is soft.  Musculoskeletal:        General: Normal range of motion.     Cervical back: Neck supple.  Skin:    General: Skin is warm.  Neurological:     Mental Status: He is alert and oriented to person, place, and time.  Psychiatric:        Behavior: Behavior normal.        Thought Content: Thought content normal.        Judgment: Judgment normal.  Ortho Exam Bilateral hand exams show negative carpal tunnel compressive signs.  Mildly positive grind test.  No triggering.  Negative Finkelstein's.  Good range of motion of all fingers without pain.  The joints are nontender to palpation.  There are no nodules.  No lesions.  No swelling. Specialty Comments:  No specialty comments available.  Imaging: No results found.   PMFS History: Patient Active Problem List   Diagnosis Date Noted  . Attention deficit disorder (ADD) in adult   . Gluten intolerance 09/09/2016  . Migraine    Past Medical History:  Diagnosis Date  . ADD (attention deficit disorder)   . Lactose intolerance   . Migraine    when treated for ADD years ago on adderrall- resolved off this  . Vertigo     Family History  Adopted: Yes  Problem Relation Age of Onset  . Crohn's disease Other        unknown who in family as adopted but knows a family member had this.     Past  Surgical History:  Procedure Laterality Date  . none     Social History   Occupational History  . Occupation: Tree surgeon  Tobacco Use  . Smoking status: Never Smoker  . Smokeless tobacco: Never Used  Substance and Sexual Activity  . Alcohol use: Yes    Comment: occasional about 1 a week  . Drug use: No  . Sexual activity: Not on file

## 2019-10-25 ENCOUNTER — Encounter: Payer: Self-pay | Admitting: Family Medicine

## 2019-11-09 ENCOUNTER — Encounter: Payer: Self-pay | Admitting: Family Medicine

## 2019-11-12 MED ORDER — BUPROPION HCL ER (XL) 150 MG PO TB24: 150.0000 mg | ORAL_TABLET | Freq: Every day | ORAL | 5 refills | Status: DC

## 2019-11-22 MED FILL — buPROPion HCL ER (XL) 150 M: 150 | 30 days supply | Qty: 30 | Fill #0

## 2020-01-17 ENCOUNTER — Encounter: Payer: Self-pay | Admitting: Family Medicine

## 2020-02-07 MED FILL — buPROPion HCL ER (XL) 150 M: 150 | 30 days supply | Qty: 30 | Fill #2

## 2020-03-08 MED FILL — buPROPion HCL ER (XL) 150 M: 150 | 30 days supply | Qty: 30 | Fill #3

## 2020-03-29 DIAGNOSIS — L309 Dermatitis, unspecified: Secondary | ICD-10-CM | POA: Diagnosis not present

## 2020-03-30 ENCOUNTER — Encounter: Payer: Self-pay | Admitting: Family Medicine

## 2020-04-01 ENCOUNTER — Telehealth (INDEPENDENT_AMBULATORY_CARE_PROVIDER_SITE_OTHER): Payer: 59 | Admitting: Family Medicine

## 2020-04-01 ENCOUNTER — Encounter: Payer: Self-pay | Admitting: Family Medicine

## 2020-04-01 VITALS — Wt 193.0 lb

## 2020-04-01 DIAGNOSIS — L2389 Allergic contact dermatitis due to other agents: Secondary | ICD-10-CM

## 2020-04-01 DIAGNOSIS — R21 Rash and other nonspecific skin eruption: Secondary | ICD-10-CM

## 2020-04-01 MED ORDER — CEPHALEXIN 500 MG PO CAPS: 500.0000 mg | ORAL_CAPSULE | Freq: Two times a day (BID) | ORAL | 0 refills | Status: DC

## 2020-04-01 NOTE — Progress Notes (Signed)
Virtual Visit via Video Note  I connected with Juan Boone   on 04/01/20 at 10:20 AM EDT by a video enabled telemedicine application and verified that I am speaking with the correct person using two identifiers.  Location patient: home Location provider:work or home office Persons participating in the virtual visit: patient, provider  I discussed the limitations of evaluation and management by telemedicine and the availability of in person appointments. The patient expressed understanding and agreed to proceed.   HPI:  Acute visit for a rash: -started about 3-4 weeks ago  -he got what he thought was poison ivy with vesicular, pruritic lesions on the R leg and L arm initially  - he tried benadryl gel -then developed a rash around the spot on his leg, and another lesion on his L flank -went to [facility] a few days ago and was given allegra and a steroid cream which he only just started -no pus, drainage or crusting - but felt like the redness around the area on his leg had been extending a few days ago, dog had initially scratched him here - did not break the skin, but wife is worried if this lesion became infected -now the arm has mostly healed up, but a few more spots popped up on both arms over the weekend - very itchy -has a dog and does some yard work; has poison ivy in the yard - he tends to get terrible reaction to it -no known tick bites, fleas -denies any other symptoms such as SOB, facial or throat swelling, fevers, malaise, lesion in or around eyes or mucus membranes   ROS: See pertinent positives and negatives per HPI.  Past Medical History:  Diagnosis Date  . ADD (attention deficit disorder)   . Lactose intolerance   . Migraine    when treated for ADD years ago on adderrall- resolved off this  . Vertigo     Past Surgical History:  Procedure Laterality Date  . none      Family History  Adopted: Yes  Problem Relation Age of Onset  . Crohn's disease Other        unknown  who in family as adopted but knows a family member had this.     SOCIAL HX: see hpi   Current Outpatient Medications:  .  amphetamine-dextroamphetamine (ADDERALL XR) 30 MG 24 hr capsule, Take 30 mg by mouth daily., Disp: , Rfl:  .  B Complex Vitamins (B COMPLEX PO), Take by mouth., Disp: , Rfl:  .  baclofen (LIORESAL) 10 MG tablet, Take 10 mg by mouth 3 (three) times daily., Disp: , Rfl:  .  BIOTIN PO, Take by mouth., Disp: , Rfl:  .  buPROPion (WELLBUTRIN XL) 150 MG 24 hr tablet, Take 1 tablet (150 mg total) by mouth daily., Disp: 30 tablet, Rfl: 5 .  diclofenac (VOLTAREN) 75 MG EC tablet, Take 1 tablet (75 mg total) by mouth 2 (two) times daily., Disp: 30 tablet, Rfl: 2 .  LACTASE ENZYME PO, Take 1 packet by mouth 3 (three) times daily with meals as needed (lactose intolerance)., Disp: , Rfl:  .  omeprazole (PRILOSEC) 40 MG capsule, Take 1 capsule (40 mg total) by mouth daily., Disp: 30 capsule, Rfl: 11 .  Probiotic Product (PROBIOTIC PO), Take by mouth., Disp: , Rfl:  .  cephALEXin (KEFLEX) 500 MG capsule, Take 1 capsule (500 mg total) by mouth 2 (two) times daily., Disp: 10 capsule, Rfl: 0  Current Facility-Administered Medications:  .  0.9 %  sodium chloride infusion, 500 mL, Intravenous, Once, Hilarie Fredrickson, MD  EXAM:  VITALS per patient if applicable:denies fevers  GENERAL: alert, oriented, appears well and in no acute distress  HEENT: atraumatic, conjunttiva clear, no obvious abnormalities on inspection of external nose and ears  NECK: normal movements of the head and neck  LUNGS: on inspection no signs of respiratory distress, breathing rate appears normal, no obvious gross SOB, gasping or wheezing  CV: no obvious cyanosis  Skin: he shows me some photos of the arm, leg and flank from before and recent - looks like for the most part improving, does appear to have erythematous papulovesicular, irr patches - some mild finely papular erythema around the lesion on the leg -  initial lesion almost looks like dermatographia, no purulence/crusting/target like lesions appreciated  MS: moves all visible extremities without noticeable abnormality  PSYCH/NEURO: pleasant and cooperative, no obvious depression or anxiety, speech and thought processing grossly intact  ASSESSMENT AND PLAN:  Discussed the following assessment and plan:  Skin rash  Allergic contact dermatitis due to other agents  -we discussed possible serious and likely etiologies, options for evaluation and workup, limitations of telemedicine visit vs in person visit, treatment, treatment risks and precautions. Pt prefers to treat via telemedicine empirically rather then risking or undertaking an in person visit at this moment. Advised bid topical steroid cream for 1 week, then qd for 1 week on areas of rash. He feels ok with that rather than an oral steroid given small areas involved and none on the face. Given his concern for infection at the initial lesion added 5 day course of keflex. Follow up advised in 5-7 days. Work/School slipped offered: n/a Advised to seek prompt follow up telemedicine visit or in person care sooner if worsening, new symptoms arise, or if is not improving with treatment.    I discussed the assessment and treatment plan with the patient. The patient was provided an opportunity to ask questions and all were answered. The patient agreed with the plan and demonstrated an understanding of the instructions.   The patient was advised to call back or seek an in-person evaluation if the symptoms worsen or if the condition fails to improve as anticipated.   [name], DO

## 2020-04-01 NOTE — Patient Instructions (Signed)
-  I sent the medication(s) we discussed to your pharmacy: Meds ordered this encounter  Medications   cephALEXin (KEFLEX) 500 MG capsule    Sig: Take 1 capsule (500 mg total) by mouth 2 (two) times daily.    Dispense:  10 capsule    Refill:  0    Please let us know if you have any questions or concerns regarding this prescription.  Please use the topical steroid cream twice daily for 1 week on the areas of concern, then once daily for 1 week. Avoid scratching. Continue the Allegra once daily.  Follow up in 5-7 days.  I hope you are feeling better soon! Seek care promptly if your symptoms worsen, new concerns arise or you are not improving with treatment.

## 2020-04-01 NOTE — Progress Notes (Signed)
LVM asking patient to give the office a call back.  

## 2020-04-07 MED FILL — buPROPion HCL ER (XL) 150 M: 150 | 30 days supply | Qty: 30 | Fill #4

## 2020-05-07 MED FILL — buPROPion HCL ER (XL) 150 M: 150 | 30 days supply | Qty: 30 | Fill #5

## 2020-06-06 ENCOUNTER — Other Ambulatory Visit: Payer: Self-pay | Admitting: Family Medicine

## 2020-06-06 MED FILL — buPROPion HCL ER (XL) 150 M: 150 | 30 days supply | Qty: 30 | Fill #0

## 2020-06-30 MED FILL — buPROPion HCL ER (XL) 150 M: 150 | 30 days supply | Qty: 30 | Fill #1

## 2020-07-30 MED FILL — buPROPion HCL ER (XL) 150 M: 150 | 30 days supply | Qty: 30 | Fill #2

## 2020-08-29 MED FILL — buPROPion HCL ER (XL) 150 M: 150 | 30 days supply | Qty: 30 | Fill #3

## 2020-09-28 MED FILL — buPROPion HCL ER (XL) 150 M: 150 | 30 days supply | Qty: 30 | Fill #4

## 2020-10-28 MED FILL — buPROPion HCL ER (XL) 150 M: 150 | 30 days supply | Qty: 30 | Fill #5

## 2020-10-30 ENCOUNTER — Other Ambulatory Visit (HOSPITAL_BASED_OUTPATIENT_CLINIC_OR_DEPARTMENT_OTHER): Payer: Self-pay

## 2020-12-23 IMAGING — DX DG HAND COMPLETE 3+V*R*
3 series · 3 of 3 positions shown · non-contrast
Comparison: None.

CLINICAL DATA: Bilateral thumb pain, base of thumb pain.

EXAM:
RIGHT HAND - COMPLETE 3+ VIEW

[hand pa]
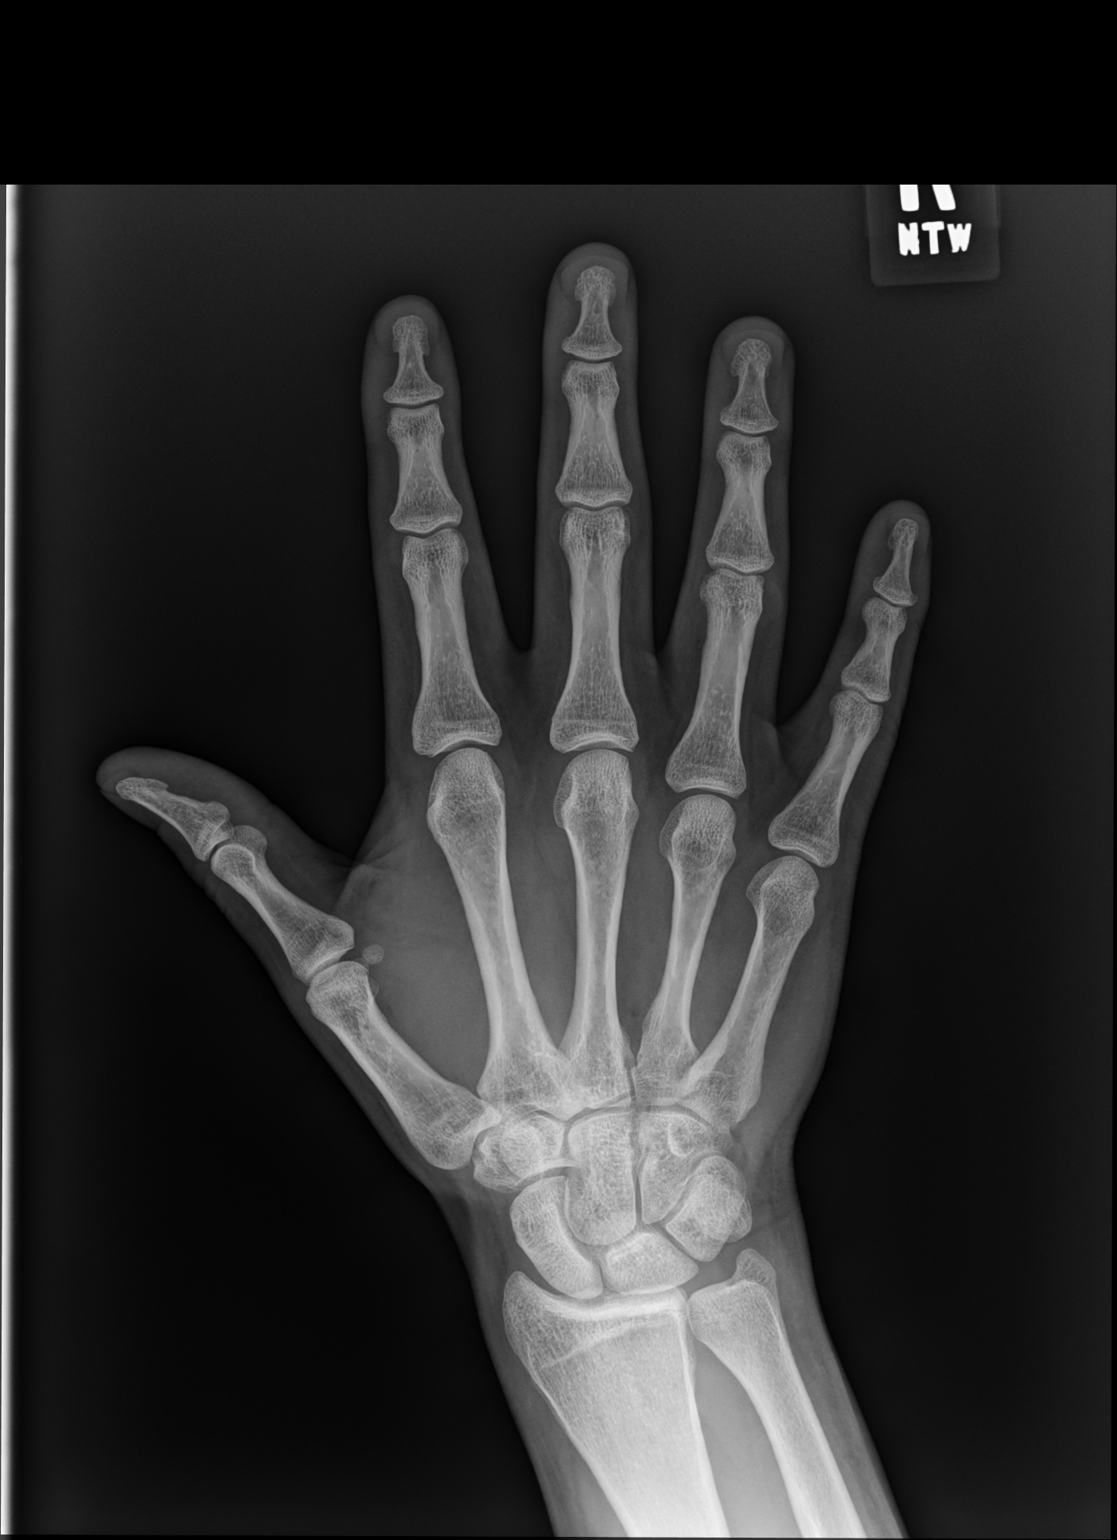

[hand oblique]
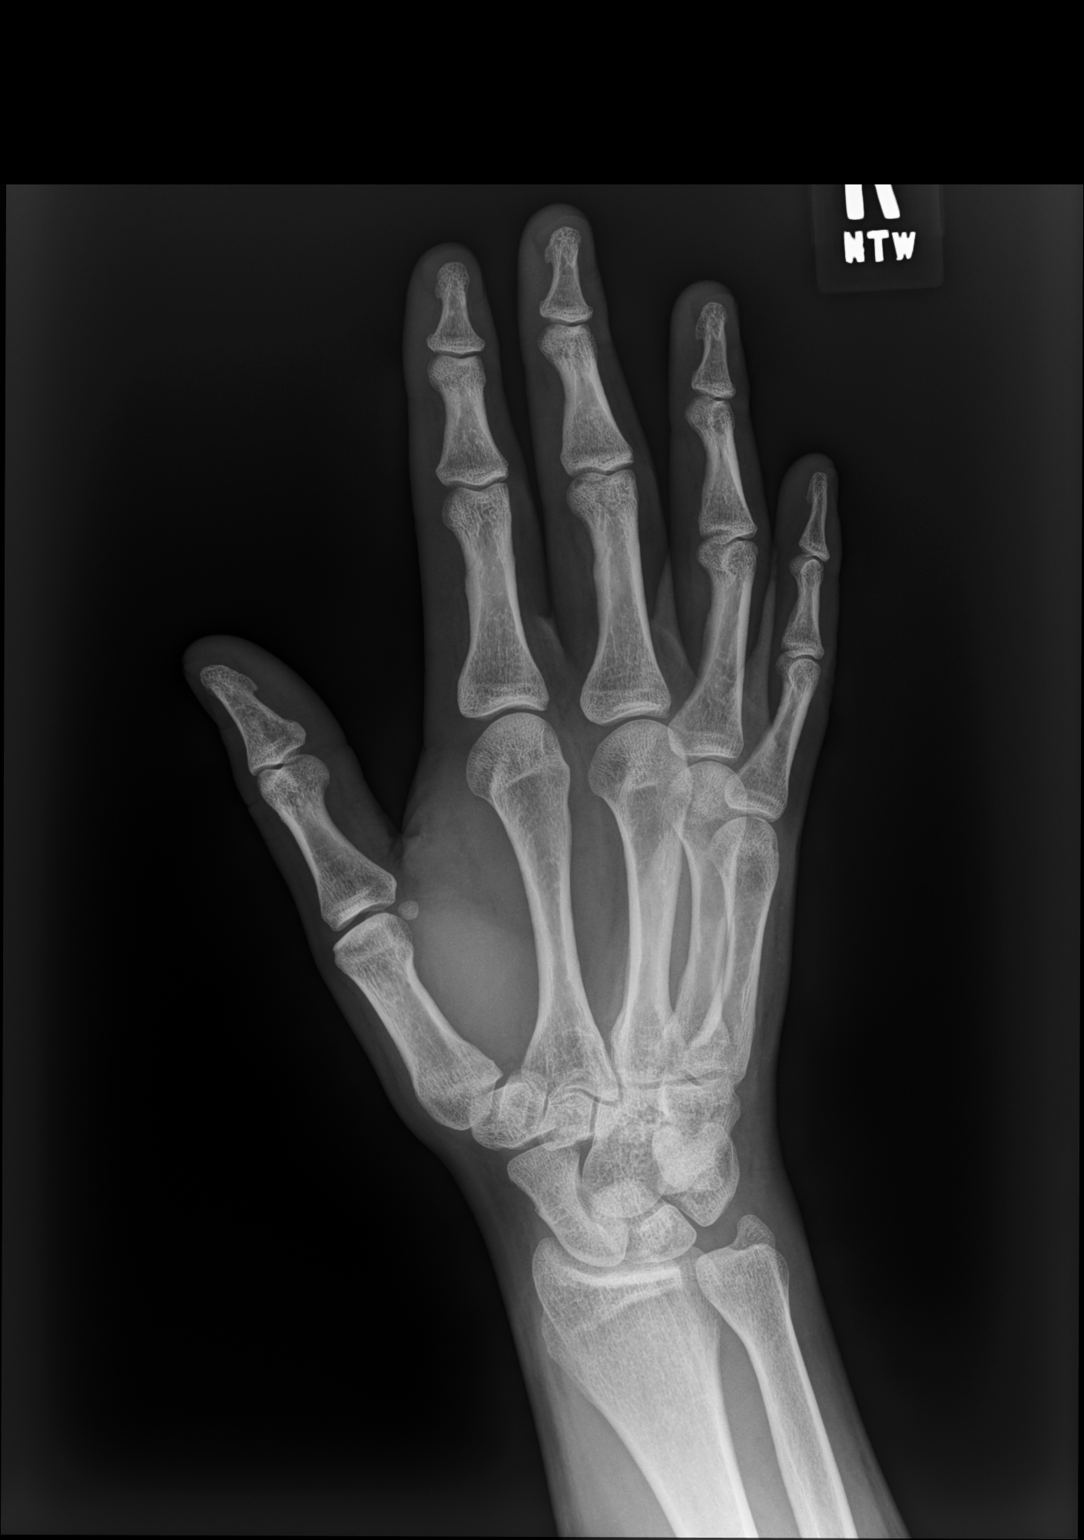

[hand lat]
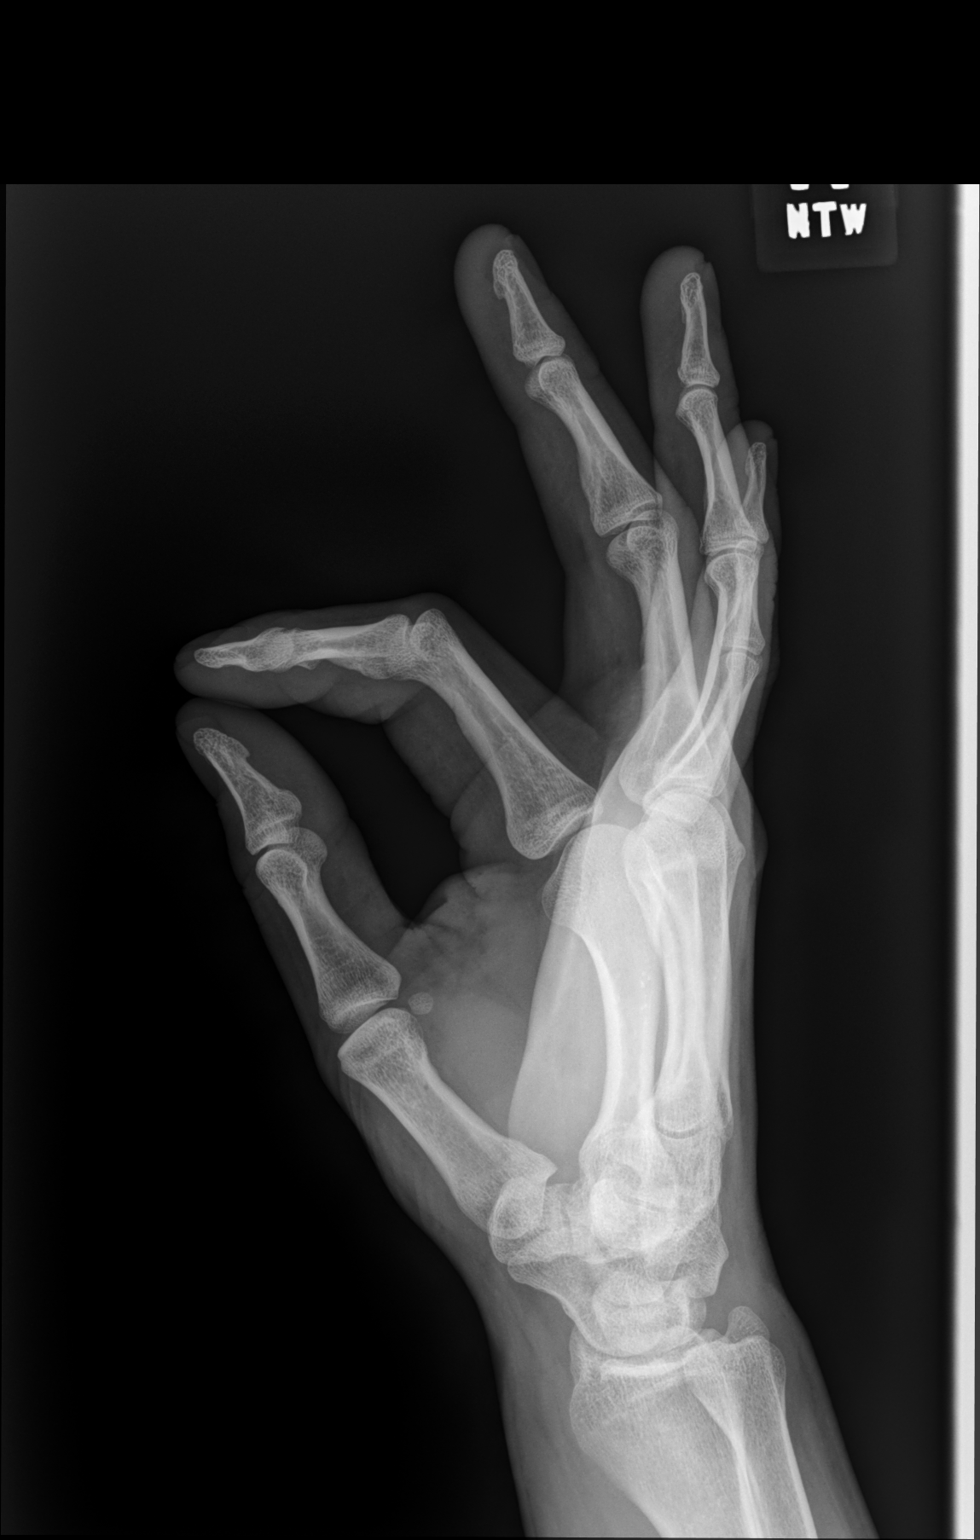

[3 of 3 positions shown; findings below may reference images not displayed]

FINDINGS: No acute or degenerative findings.
IMPRESSION: Negative.

## 2020-12-23 IMAGING — DX DG HAND COMPLETE 3+V*L*
3 series · 3 of 3 positions shown · non-contrast
Comparison: None.

CLINICAL DATA: Base of thumb pain.

EXAM:
LEFT HAND - COMPLETE 3+ VIEW

[hand pa]
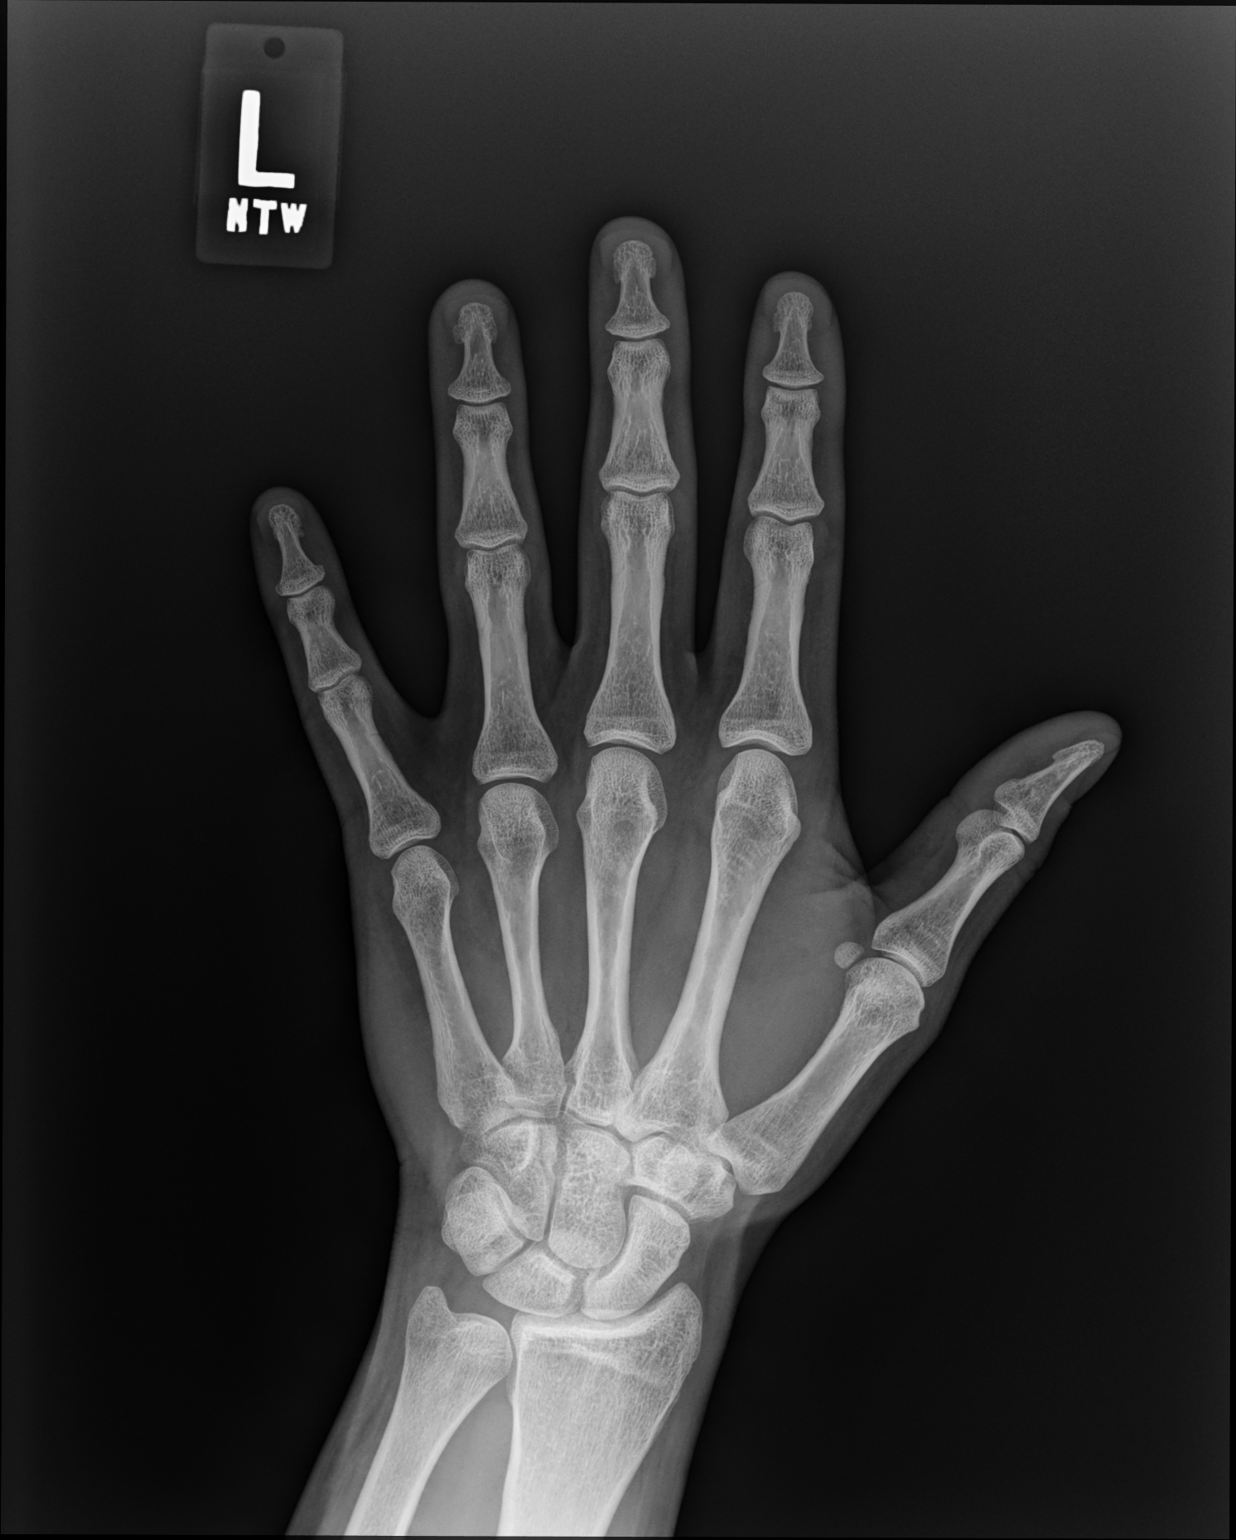

[hand oblique]
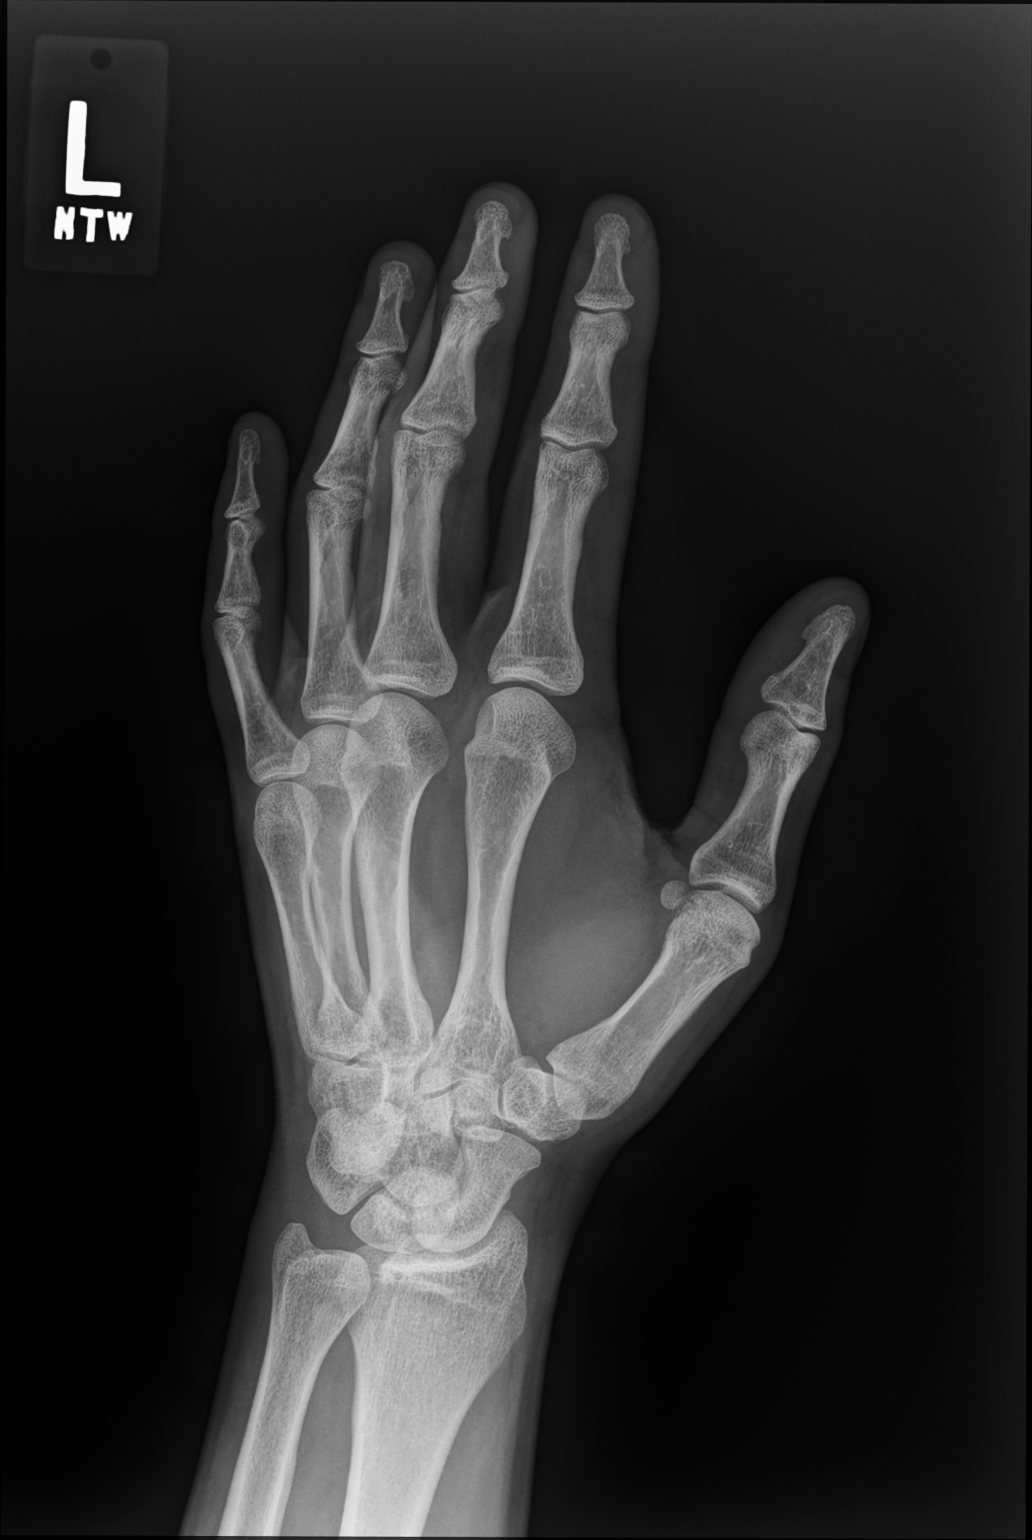

[hand lat]
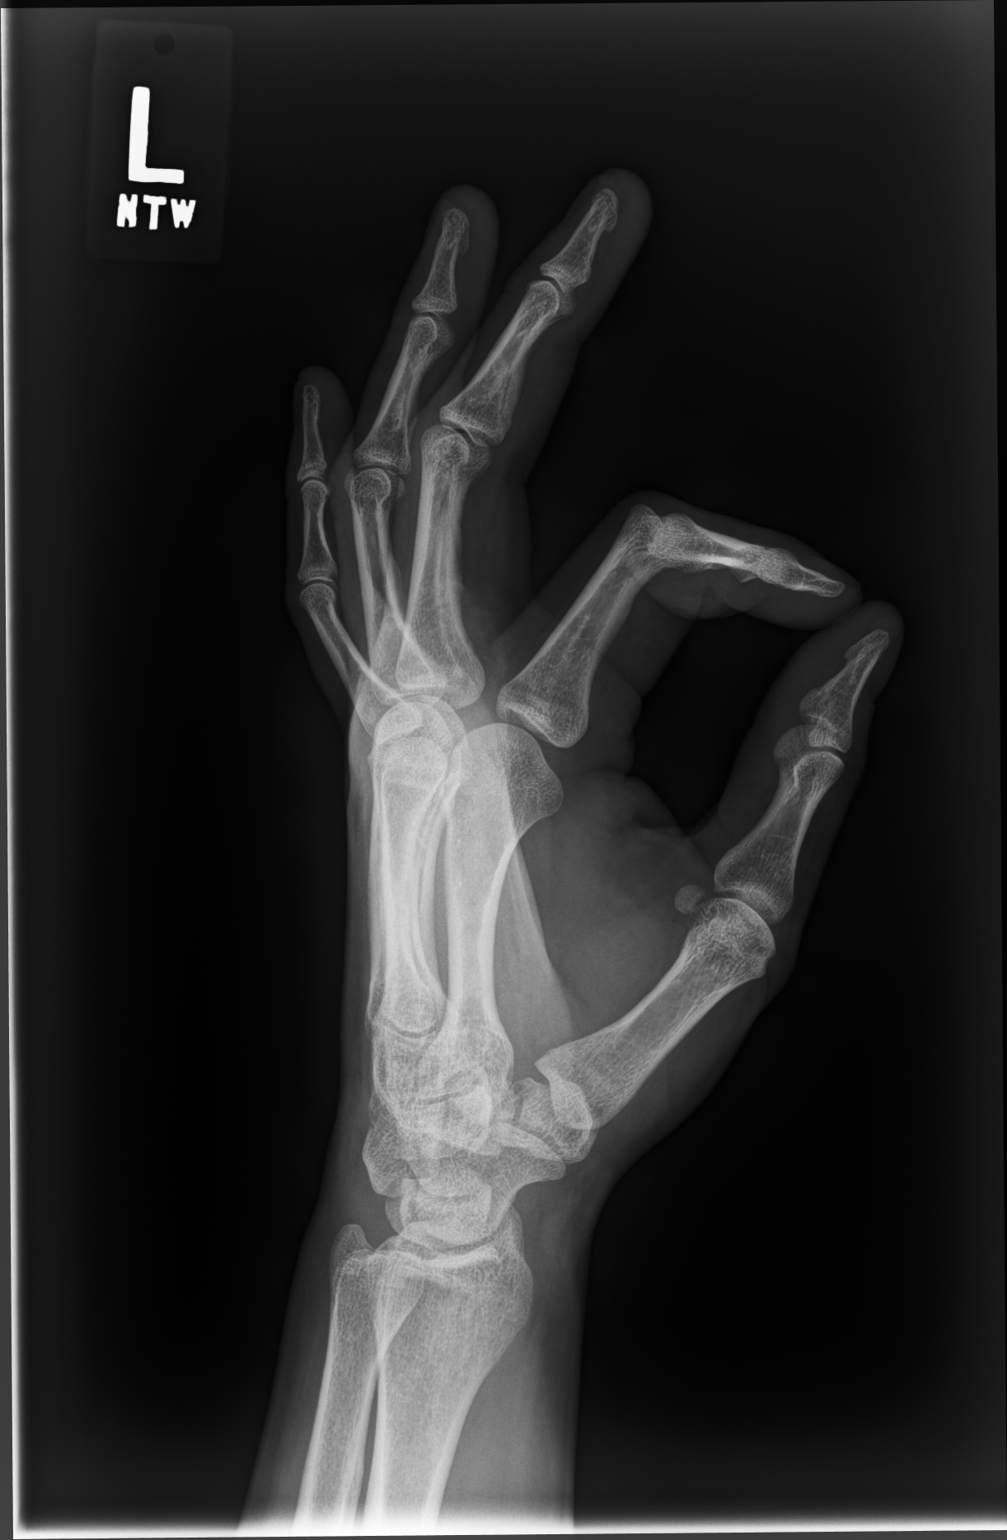

[3 of 3 positions shown; findings below may reference images not displayed]

FINDINGS: No acute or degenerative findings.
IMPRESSION: Negative.

## 2021-02-10 ENCOUNTER — Other Ambulatory Visit (HOSPITAL_COMMUNITY): Payer: Self-pay

## 2021-02-10 ENCOUNTER — Other Ambulatory Visit: Payer: Self-pay | Admitting: Family Medicine

## 2021-02-11 ENCOUNTER — Other Ambulatory Visit (HOSPITAL_COMMUNITY): Payer: Self-pay

## 2021-02-11 MED ORDER — BUPROPION HCL ER (XL) 150 MG PO TB24
ORAL_TABLET | Freq: Every day | ORAL | 0 refills | Status: DC
  Filled 2021-02-11: qty 30, 30d supply, fill #0

## 2021-02-12 ENCOUNTER — Other Ambulatory Visit (HOSPITAL_COMMUNITY): Payer: Self-pay

## 2021-02-15 ENCOUNTER — Encounter: Payer: Self-pay | Admitting: Family Medicine

## 2021-02-16 ENCOUNTER — Other Ambulatory Visit: Payer: Self-pay

## 2021-02-16 DIAGNOSIS — M25519 Pain in unspecified shoulder: Secondary | ICD-10-CM

## 2021-03-12 NOTE — Progress Notes (Signed)
   I, Christoper Fabian, LAT, ATC, am serving as scribe for Dr. Clementeen Graham.  Juan Boone is a 30 y.o. male who presents to [facility] today for L shoulder pain.  He was last seen by Dr. Denyse Amass on 07/12/19 for L hand pain.  Since then, pt reports L shoulder pain ongoing since Memorial Day weekend. Pt recalls falling a couple of times that weekend and had been paddle boarding.  He locates his pain to the lateral and superior aspect of GH joint.  L shoulder mechanical symptoms: yes Radiating pain: no Aggravating factors: IR, L-side lying Treatments tried: none   Pertinent review of systems: No fevers or chest  Relevant historical information: ADHD.  History of thumb arthritis.   Exam:  BP (!) 138/92   Pulse 64   Ht 5\' 10"  (1.778 m)   Wt 216 lb 6.4 oz (98.2 kg)   SpO2 99%   BMI 31.05 kg/m  General: Well Developed, well nourished, and in no acute distress.   MSK: Left shoulder normal-appearing Nontender. Normal motion pain with abduction. Intact strength. Positive Hawkins and Neer's test. Positive empty can test. Negative Yergason's and speeds test.    Lab and Radiology Results  [facility]K Ultrasound of: Left shoulder Biceps tendon intact normal-appearing Subscapularis tendon intact normal. Supraspinatus tendon is intact. Moderate subacromial bursitis is present. Infraspinatus tendon normal-appearing AC joint normal-appearing Impression: Moderate subacromial bursitis    Assessment and Plan: 30 y.o. male with left shoulder pain due to subacromial bursitis.  Plan for physical therapy referral and home exercise program.  Recheck in 6 to 7 weeks.  If not better would consider injection or MRI.   PDMP not reviewed this encounter. Orders Placed This Encounter  Procedures   34 LIMITED JOINT SPACE STRUCTURES UP LEFT(NO LINKED CHARGES)    Standing Status:   Future    Number of Occurrences:   1    Standing Expiration Date:    09/13/2021    Order Specific Question:   Reason for Exam (SYMPTOM  OR DIAGNOSIS REQUIRED)    Answer:   left shoulder pain    Order Specific Question:   Preferred imaging location?    Answer:   [facility] referral to Physical Therapy    Referral Priority:   Routine    Referral Type:   Physical Medicine    Referral Reason:   Specialty Services Required    Requested Specialty:   Physical Therapy    Number of Visits Requested:   1   No orders of the defined types were placed in this encounter.    Discussed warning signs or symptoms. Please see discharge instructions. Patient expresses understanding.   The above documentation has been reviewed and is accurate and complete [facility], M.D.

## 2021-03-13 ENCOUNTER — Ambulatory Visit (INDEPENDENT_AMBULATORY_CARE_PROVIDER_SITE_OTHER): Payer: BC Managed Care – PPO | Admitting: Family Medicine

## 2021-03-13 ENCOUNTER — Encounter: Payer: Self-pay | Admitting: Family Medicine

## 2021-03-13 ENCOUNTER — Other Ambulatory Visit: Payer: Self-pay

## 2021-03-13 ENCOUNTER — Ambulatory Visit: Payer: Self-pay

## 2021-03-13 VITALS — BP 138/92 | HR 64 | Ht 70.0 in | Wt 216.4 lb

## 2021-03-13 DIAGNOSIS — M25512 Pain in left shoulder: Secondary | ICD-10-CM | POA: Diagnosis not present

## 2021-03-13 DIAGNOSIS — M189 Osteoarthritis of first carpometacarpal joint, unspecified: Secondary | ICD-10-CM

## 2021-03-13 HISTORY — DX: Osteoarthritis of first carpometacarpal joint, unspecified: M18.9

## 2021-03-13 NOTE — Patient Instructions (Signed)
Thank you for coming in today.   I've referred you to Physical Therapy.  Let us know if you don't hear from them in one week.   If not improved next step is typically injection.   Recheck in 7 weeks unless better.

## 2021-03-25 ENCOUNTER — Other Ambulatory Visit: Payer: Self-pay

## 2021-03-25 ENCOUNTER — Encounter: Payer: Self-pay | Admitting: Physical Therapy

## 2021-03-25 ENCOUNTER — Other Ambulatory Visit: Payer: Self-pay | Admitting: Family Medicine

## 2021-03-25 ENCOUNTER — Ambulatory Visit (INDEPENDENT_AMBULATORY_CARE_PROVIDER_SITE_OTHER): Payer: BC Managed Care – PPO | Admitting: Physical Therapy

## 2021-03-25 ENCOUNTER — Other Ambulatory Visit (HOSPITAL_COMMUNITY): Payer: Self-pay

## 2021-03-25 DIAGNOSIS — M6281 Muscle weakness (generalized): Secondary | ICD-10-CM

## 2021-03-25 DIAGNOSIS — M25612 Stiffness of left shoulder, not elsewhere classified: Secondary | ICD-10-CM | POA: Diagnosis not present

## 2021-03-25 DIAGNOSIS — M25512 Pain in left shoulder: Secondary | ICD-10-CM
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
## 2021-03-25 NOTE — Patient Instructions (Signed)
Access Code: [email or web address] URL: [email or web address] Date: 03/25/2021 Prepared by: Narda Amber  Exercises Supine Shoulder Flexion with Dowel - 2-3 x daily - 7 x weekly - 2 sets - 10 reps - 3 seconds hold Supine Shoulder External Rotation with Dowel - 2-3 x daily - 7 x weekly - 2 sets - 10 reps - 3 seconds hold Standing Shoulder Row with Anchored Resistance - 2-3 x daily - 7 x weekly - 2 sets - 10 reps Standing Shoulder Internal Rotation Stretch with Towel - 2-3 x daily - 7 x weekly - 2 sets - 10 reps - 5 seconds hold

## 2021-03-26 ENCOUNTER — Other Ambulatory Visit (HOSPITAL_COMMUNITY): Payer: Self-pay

## 2021-03-26 MED ORDER — BUPROPION HCL ER (XL) 150 MG PO TB24
ORAL_TABLET | Freq: Every day | ORAL | 0 refills | Status: DC
  Filled 2021-03-26: qty 30, 30d supply, fill #0

## 2021-03-30 ENCOUNTER — Ambulatory Visit: Payer: BC Managed Care – PPO | Admitting: Physical Therapy

## 2021-04-08 ENCOUNTER — Encounter: Payer: Self-pay | Admitting: Physical Therapy

## 2021-04-08 ENCOUNTER — Ambulatory Visit (INDEPENDENT_AMBULATORY_CARE_PROVIDER_SITE_OTHER): Payer: BC Managed Care – PPO | Admitting: Physical Therapy

## 2021-04-08 ENCOUNTER — Other Ambulatory Visit: Payer: Self-pay

## 2021-04-08 DIAGNOSIS — M6281 Muscle weakness (generalized): Secondary | ICD-10-CM

## 2021-04-08 DIAGNOSIS — M25612 Stiffness of left shoulder, not elsewhere classified: Secondary | ICD-10-CM

## 2021-04-08 DIAGNOSIS — M25512 Pain in left shoulder: Secondary | ICD-10-CM | POA: Diagnosis not present

## 2021-04-08 NOTE — Therapy (Signed)
[facility] Physical Therapy [address] [place], [place], [address] Phone: [phone]   Fax:  [phone]  Physical Therapy Treatment  Patient Details  Name: Ernestine Langworthy MRN: [id number] Date of Birth: [birth year] Referring Provider (PT): Gaynelle Arabian, MD   Encounter Date: 04/08/2021   PT End of Session - 04/08/21 0925     Visit Number 2    Number of Visits 6    Date for PT Re-Evaluation 05/08/21    Authorization Type BCBS    PT Start Time [phone]    PT Stop Time 0922    PT Time Calculation (min) 39 min    Activity Tolerance Patient tolerated treatment well    Behavior During Therapy [facility] for tasks assessed/performed             Past Medical History:  Diagnosis Date   ADD (attention deficit disorder)    CMC arthritis, thumb, 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   Subjective Assessment - 04/08/21 0847     Subjective shoulder is "a little better." hasn't done exercises as much as he should.  did one stretch wrong that seemed to irritate it.    Limitations Lifting;Writing;House hold activities    Patient Stated Goals Stop hurting    Currently in Pain? Yes    Pain Score 3     Pain Location Shoulder    Pain Orientation Left    Pain Descriptors / Indicators Sore;Aching    Pain Type Acute pain    Pain Onset More than a month ago    Pain Frequency Intermittent    Aggravating Factors  lifting, reaching behind back    Pain Relieving Factors changing positions                               [facility] Adult PT Treatment/Exercise - 04/08/21 0848       Shoulder Exercises: Supine   Flexion AAROM;Both;10 reps    Flexion Limitations 1# bar      Shoulder Exercises: Standing   External Rotation 20  reps;Left;Theraband    Theraband Level (Shoulder External Rotation) Level 3 (Green)    Internal Rotation Left;20 reps;Theraband    Theraband Level (Shoulder Internal Rotation) Level 3 (Green)    Flexion Left;Weights   3x10   Shoulder Flexion Weight (lbs) 2    ABduction Left;Weights   3x10   Shoulder ABduction Weight (lbs) 2    Row Strengthening;Both;20 reps;Theraband    Theraband Level (Shoulder Row) Level 4 (Blue)      Shoulder Exercises: ROM/Strengthening   UBE (Upper Arm Bike) L3 x 6 min (3' each direction)      Shoulder Exercises: Stretch   Internal Rotation Stretch 30 seconds    Internal Rotation Stretch Limitations 2 reps; with towel      Manual Therapy   Manual Therapy Joint mobilization    Joint Mobilization Lt shoulder g2-3 A/P and inf mobs as well as STM and PROM to Lt shoulder                         PT Long Term Goals - 03/25/21 1206  PT LONG TERM GOAL #1   Title Pt to be independent wtih final HEP    Time 6    Period Weeks    Status New    Target Date 05/08/21      PT LONG TERM GOAL #2   Title Pt will improve his left shoulder flexion to >/= 155 degrees with no pain reported.    Time 6    Period Weeks    Status New    Target Date 05/08/21      PT LONG TERM GOAL #3   Title Pt will be able to perform over head lifting of 20# using bilateral UE's to place item on overhead shelf.    Time 6    Period Weeks    Status New    Target Date 05/08/21      PT LONG TERM GOAL #4   Title Pt will be able to perform left shoulder IR thumb up back to  >/= T8 with pain </= 2/10.    Time 6    Period Weeks    Status New    Target Date 05/08/21      PT LONG TERM GOAL #5   Title Pt will improve his FOTO from 51% to 76%.    Time 6    Period Weeks    Status New    Target Date 05/08/21                   Plan - 04/08/21 0925     Clinical Impression Statement Pt reporting slight improvement in symptoms and some compliance with HEP at this  time.  Tolerated session well today with some expected discomfort, especially with IR/ER activities.  Will continue to benefit from PT to maximize function.    Personal Factors and Comorbidities Comorbidity 1    Comorbidities ADHD    Examination-Activity Limitations Lift;Reach Overhead    Examination-Participation Restrictions Yard Work;Laundry;Community Activity    Stability/Clinical Decision Making Stable/Uncomplicated    Rehab Potential Good    PT Frequency 1x / week    PT Duration 6 weeks    PT Treatment/Interventions ADLs/Self Care Home Management;Cryotherapy;Electrical Stimulation;DME Instruction;Ultrasound;Traction;Moist Heat;Iontophoresis 4mg /ml Dexamethasone;Functional mobility training;Therapeutic activities;Therapeutic exercise;Balance training;Neuromuscular re-education;Manual techniques;Patient/family education;Passive range of motion;Dry needling;Taping;Spinal Manipulations;Joint Manipulations    PT Next Visit Plan manual PRN, strengthening,  consider dry needling (deferred due to improvement)    PT Home Exercise Plan Access Code:    Consulted and Agree with Plan of Care Patient             Patient will benefit from skilled therapeutic intervention in order to improve the following deficits and impairments:  Decreased strength, Increased edema, Postural dysfunction, Impaired flexibility, Decreased activity tolerance, Impaired UE functional use, Pain, Decreased range of motion  Visit Diagnosis: Acute pain of left shoulder  Muscle weakness (generalized)  Stiffness of left shoulder, not elsewhere classified     Problem List Patient Active Problem List   Diagnosis Date Noted   CMC arthritis, thumb, 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

## 2021-04-15 ENCOUNTER — Other Ambulatory Visit: Payer: Self-pay

## 2021-04-15 ENCOUNTER — Encounter: Payer: Self-pay | Admitting: Physical Therapy

## 2021-04-15 ENCOUNTER — Ambulatory Visit (INDEPENDENT_AMBULATORY_CARE_PROVIDER_SITE_OTHER): Payer: BC Managed Care – PPO | Admitting: Physical Therapy

## 2021-04-15 DIAGNOSIS — M6281 Muscle weakness (generalized): Secondary | ICD-10-CM | POA: Diagnosis not present

## 2021-04-15 DIAGNOSIS — M25512 Pain in left shoulder: Secondary | ICD-10-CM

## 2021-04-15 DIAGNOSIS — M25612 Stiffness of left shoulder, not elsewhere classified: Secondary | ICD-10-CM

## 2021-04-15 NOTE — Therapy (Signed)
[facility] Physical Therapy [address] [place], [place], [address] Phone: [phone]   Fax:  [phone]  Physical Therapy Treatment  Patient Details  Name: Juan Boone MRN: [id number] Date of Birth: 11/27/1990 Referring Provider (PT): Gaynelle Arabian, MD   Encounter Date: 04/15/2021   PT End of Session - 04/15/21 0839     Visit Number 3    Number of Visits 6    Date for PT Re-Evaluation 05/08/21    Authorization Type BCBS    PT Start Time 0801    PT Stop Time 0839    PT Time Calculation (min) 38 min    Activity Tolerance Patient tolerated treatment well    Behavior During Therapy [facility] for tasks assessed/performed             Past Medical History:  Diagnosis Date   ADD (attention deficit disorder)    CMC arthritis, thumb, 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   Subjective Assessment - 04/15/21 0803     Subjective still has a constant ache, and certain motions aggravate shoulder    Limitations Lifting;Writing;House hold activities    Patient Stated Goals Stop hurting    Currently in Pain? Yes    Pain Score 2     Pain Location Shoulder    Pain Orientation Left    Pain Descriptors / Indicators Aching;Constant    Pain Type Acute pain    Pain Onset More than a month ago    Pain Frequency Intermittent    Aggravating Factors  reaching behind back, hands behind head; rotation movements    Pain Relieving Factors changing positions                               [facility] Adult PT Treatment/Exercise - 04/15/21 0804       Shoulder Exercises: Supine   Flexion Left;20 reps;Weights    Shoulder Flexion Weight (lbs) 3    Flexion Limitations 10 sec hold      Shoulder Exercises: Sidelying   ABduction Left;20  reps;Weights    ABduction Weight (lbs) 3, 2    ABduction Limitations 10 sec hold      Shoulder Exercises: Standing   External Rotation Left;Theraband;20 reps   stopped at 20 reps due to pain   Theraband Level (Shoulder External Rotation) Level 3 (Green)    Internal Rotation Left;Theraband   3x10   Theraband Level (Shoulder Internal Rotation) Level 3 (Green)      Shoulder Exercises: ROM/Strengthening   UBE (Upper Arm Bike) L3 x 6 min (3' each direction)    Lat Pull Limitations 25# 3x10    Cybex Row Limitations 35# 3x10    Proximal Shoulder Strengthening, Seated standing Lt shoulder 90 deg flex and then abdct with 2# med ball, circles CW/CCW x 20 reps each      Shoulder Exercises: Stretch   Other Shoulder Stretches Lt doorway stretch 3x20 sec      Manual Therapy   Manual therapy comments use of percussive device to middle deltoid and infraspinatus trigger points; discussed use of tennis ball at home as well  Upper Extremity Functional Index Score :   /80        PT Long Term Goals - 03/25/21 1206       PT LONG TERM GOAL #1   Title Pt to be independent wtih final HEP    Time 6    Period Weeks    Status New    Target Date 05/08/21      PT LONG TERM GOAL #2   Title Pt will improve his left shoulder flexion to >/= 155 degrees with no pain reported.    Time 6    Period Weeks    Status New    Target Date 05/08/21      PT LONG TERM GOAL #3   Title Pt will be able to perform over head lifting of 20# using bilateral UE's to place item on overhead shelf.    Time 6    Period Weeks    Status New    Target Date 05/08/21      PT LONG TERM GOAL #4   Title Pt will be able to perform left shoulder IR thumb up back to  >/= T8 with pain </= 2/10.    Time 6    Period Weeks    Status New    Target Date 05/08/21      PT LONG TERM GOAL #5   Title Pt will improve his FOTO from 51% to 76%.    Time 6    Period Weeks    Status New    Target Date 05/08/21                    Plan - 04/15/21 0840     Clinical Impression Statement Pt tolerated session well with expected fatigue following session. Requested to hold on DN for now, so utilized percussive device and tennis ball at home to assist with TPR.  Will continue to benefit from PT to maximize function.    Personal Factors and Comorbidities Comorbidity 1    Comorbidities ADHD    Examination-Activity Limitations Lift;Reach Overhead    Examination-Participation Restrictions Yard Work;Laundry;Community Activity    Stability/Clinical Decision Making Stable/Uncomplicated    Rehab Potential Good    PT Frequency 1x / week    PT Duration 6 weeks    PT Treatment/Interventions ADLs/Self Care Home Management;Cryotherapy;Electrical Stimulation;DME Instruction;Ultrasound;Traction;Moist Heat;Iontophoresis 4mg /ml Dexamethasone;Functional mobility training;Therapeutic activities;Therapeutic exercise;Balance training;Neuromuscular re-education;Manual techniques;Patient/family education;Passive range of motion;Dry needling;Taping;Spinal Manipulations;Joint Manipulations    PT Next Visit Plan manual PRN, strengthening,  consider dry needling (deferred at pt's request)    PT Home Exercise Plan Access Code:    Consulted and Agree with Plan of Care Patient             Patient will benefit from skilled therapeutic intervention in order to improve the following deficits and impairments:  Decreased strength, Increased edema, Postural dysfunction, Impaired flexibility, Decreased activity tolerance, Impaired UE functional use, Pain, Decreased range of motion  Visit Diagnosis: Acute pain of left shoulder  Muscle weakness (generalized)  Stiffness of left shoulder, not elsewhere classified     Problem List Patient Active Problem List   Diagnosis Date Noted   CMC arthritis, thumb, degenerative 03/13/2021   Attention deficit disorder (ADD) in adult    Gluten intolerance 09/09/2016    Migraine       11/07/2016, PT, DPT 04/15/21 8:42 AM    [facility] Physical Therapy [address] [place], [place], [place] Phone: [phone]   Fax:  [phone]  Name: Juan Boone MRN: [id number] Date of Birth: 1991/05/27

## 2021-04-20 ENCOUNTER — Ambulatory Visit (INDEPENDENT_AMBULATORY_CARE_PROVIDER_SITE_OTHER): Payer: BC Managed Care – PPO | Admitting: Physical Therapy

## 2021-04-20 ENCOUNTER — Other Ambulatory Visit: Payer: Self-pay

## 2021-04-20 ENCOUNTER — Encounter: Payer: Self-pay | Admitting: Physical Therapy

## 2021-04-20 DIAGNOSIS — M25612 Stiffness of left shoulder, not elsewhere classified: Secondary | ICD-10-CM | POA: Diagnosis not present

## 2021-04-20 DIAGNOSIS — M6281 Muscle weakness (generalized): Secondary | ICD-10-CM | POA: Diagnosis not present

## 2021-04-20 DIAGNOSIS — M25512 Pain in left shoulder: Secondary | ICD-10-CM | POA: Diagnosis not present

## 2021-04-20 NOTE — Therapy (Signed)
[facility] Physical Therapy [address] [place], [place], [address] Phone: [phone]   Fax:  [phone]  Physical Therapy Treatment  Patient Details  Name: Juan Boone MRN: [id number] Date of Birth: [birth year] Referring Provider (PT): Gaynelle Arabian, MD   Encounter Date: 04/20/2021   PT End of Session - 04/20/21 8756     Visit Number 4    Number of Visits 6    Date for PT Re-Evaluation 05/08/21    Authorization Type BCBS    PT Start Time 0802    PT Stop Time 0842    PT Time Calculation (min) 40 min    Activity Tolerance Patient tolerated treatment well    Behavior During Therapy [facility] for tasks assessed/performed             Past Medical History:  Diagnosis Date   ADD (attention deficit disorder)    CMC arthritis, thumb, 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   Subjective Assessment - 04/20/21 0811     Subjective Pt reporting pain over the weekend when he attempted to perform jumping jack motion with 5/10. Pt reporting 1-2/10 pain upon arrival.    Limitations Lifting;Writing;House hold activities    Patient Stated Goals Stop hurting    Currently in Pain? Yes    Pain Location Shoulder    Pain Orientation Left    Pain Descriptors / Indicators Aching    Pain Type Acute pain    Pain Onset More than a month ago    Pain Frequency Constant                               [facility] Adult PT Treatment/Exercise - 04/20/21 0001       Shoulder Exercises: Supine   Flexion Left;20 reps;Weights    Shoulder Flexion Weight (lbs) 3    Flexion Limitations 10 sec hold      Shoulder Exercises: Sidelying   ABduction Left;20 reps;Weights    ABduction Weight (lbs) 3    ABduction Limitations 10 second hold       Shoulder Exercises: Standing   External Rotation Left;Theraband;20 reps   2 x 15   Theraband Level (Shoulder External Rotation) Level 3 (Green)    Internal Rotation --    Theraband Level (Shoulder Internal Rotation) --    Flexion Strengthening;Both;Theraband    Theraband Level (Shoulder Flexion) Level 3 (Green)    Flexion Limitations 2 sets of 15,    ABduction Strengthening;Left;Weights    Shoulder ABduction Weight (lbs) 2      Shoulder Exercises: ROM/Strengthening   UBE (Upper Arm Bike) L3.3 x 6 minutes    Lat Pull Limitations 25# 3x10    Cybex Row Limitations 35# 3x10      Shoulder Exercises: Stretch   Other Shoulder Stretches 3 position door way stretch : 90 degress, 60 degrees and 120 degrees. holding 30 seconds each      Manual Therapy   Manual therapy comments precussion to deltoid, infraspinatus, and upper trap                          PT Long Term Goals -  04/20/21 0819       PT LONG TERM GOAL #1   Title Pt to be independent wtih final HEP    Status On-going      PT LONG TERM GOAL #2   Title Pt will improve his left shoulder flexion to >/= 155 degrees with no pain reported.    Status On-going      PT LONG TERM GOAL #3   Title Pt will be able to perform over head lifting of 20# using bilateral UE's to place item on overhead shelf.    Status On-going      PT LONG TERM GOAL #4   Title Pt will be able to perform left shoulder IR thumb up back to  >/= T8 with pain </= 2/10.    Status On-going      PT LONG TERM GOAL #5   Title Pt will improve his FOTO from 51% to 76%.    Status On-going                   Plan - 04/20/21 0815     Clinical Impression Statement Pt tolerating exercises well with no reports of increased pain. Progressing with strengthening and painfree range of motion. Continue skilled PT to maximize funciton.    Personal Factors and Comorbidities Comorbidity 1    Comorbidities ADHD    Examination-Activity Limitations  Lift;Reach Overhead    Examination-Participation Restrictions Yard Work;Laundry;Community Activity    Stability/Clinical Decision Making Stable/Uncomplicated    Rehab Potential Good    PT Duration 6 weeks    PT Treatment/Interventions ADLs/Self Care Home Management;Cryotherapy;Electrical Stimulation;DME Instruction;Ultrasound;Traction;Moist Heat;Iontophoresis 4mg /ml Dexamethasone;Functional mobility training;Therapeutic activities;Therapeutic exercise;Balance training;Neuromuscular re-education;Manual techniques;Patient/family education;Passive range of motion;Dry needling;Taping;Spinal Manipulations;Joint Manipulations    PT Next Visit Plan manual PRN, strengthening,  consider dry needling if pt wishes    PT Home Exercise Plan Access Code:    Consulted and Agree with Plan of Care Patient             Patient will benefit from skilled therapeutic intervention in order to improve the following deficits and impairments:  Decreased strength, Increased edema, Postural dysfunction, Impaired flexibility, Decreased activity tolerance, Impaired UE functional use, Pain, Decreased range of motion  Visit Diagnosis: Acute pain of left shoulder  Muscle weakness (generalized)  Stiffness of left shoulder, not elsewhere classified     Problem List Patient Active Problem List   Diagnosis Date Noted   CMC arthritis, thumb, 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 [place], [place], [place] Phone: [phone]   Fax:  [phone]  Name: Juan Boone MRN: [name] Date of Birth: 1990/09/21

## 2021-04-27 ENCOUNTER — Encounter: Payer: Self-pay | Admitting: Physical Therapy

## 2021-04-27 ENCOUNTER — Ambulatory Visit (INDEPENDENT_AMBULATORY_CARE_PROVIDER_SITE_OTHER): Payer: BC Managed Care – PPO | Admitting: Physical Therapy

## 2021-04-27 ENCOUNTER — Other Ambulatory Visit: Payer: Self-pay

## 2021-04-27 DIAGNOSIS — M6281 Muscle weakness (generalized): Secondary | ICD-10-CM

## 2021-04-27 DIAGNOSIS — M25612 Stiffness of left shoulder, not elsewhere classified: Secondary | ICD-10-CM | POA: Diagnosis not present

## 2021-04-27 DIAGNOSIS — M25512 Pain in left shoulder: Secondary | ICD-10-CM | POA: Diagnosis not present

## 2021-04-27 NOTE — Therapy (Signed)
[facility] Physical Therapy [address] [place], [place], [address] Phone: [phone]   Fax:  [phone]  Physical Therapy Treatment  Patient Details  Name: Juan Boone MRN: [id number] Date of Birth: 17-Feb-1991 Referring Provider (PT): Gaynelle Arabian, MD   Encounter Date: 04/27/2021   PT End of Session - 04/27/21 0808     Visit Number 5    Number of Visits 6    Date for PT Re-Evaluation 05/08/21    Authorization Type BCBS    PT Start Time 0802    PT Stop Time 0842    PT Time Calculation (min) 40 min    Activity Tolerance Patient tolerated treatment well    Behavior During Therapy [facility] for tasks assessed/performed             Past Medical History:  Diagnosis Date   ADD (attention deficit disorder)    CMC arthritis, thumb, 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   Subjective Assessment - 04/27/21 0805     Subjective Pt arriving today reporting 1/10 pain in his shoulder. Pt stating there are still times where his pain increases like when he sleeps on it.    Limitations Lifting;Writing;House hold activities    Patient Stated Goals Stop hurting    Currently in Pain? Yes    Pain Location Shoulder    Pain Orientation Left    Pain Descriptors / Indicators Aching    Pain Type Acute pain    Pain Onset More than a month ago                               [facility] Adult PT Treatment/Exercise - 04/27/21 0001       Shoulder Exercises: Standing   Diagonals Strengthening;Left    Diagonals Weight (lbs) BATCA 5# 2x15 D1 and D2    Other Standing Exercises Flexion 5# 2x15      Shoulder Exercises: ROM/Strengthening   UBE (Upper Arm Bike) L3.3 x 6 minutes    Lat Pull Limitations 25# 2x15    Cybex Row Limitations 35#  2x15      Shoulder Exercises: Stretch   Other Shoulder Stretches 3 position door way stretch holding 30 seconds      Manual Therapy   Manual therapy comments precussion to deltoid, infraspinatus, and upper trap                          PT Long Term Goals - 04/27/21 1018       PT LONG TERM GOAL #1   Title Pt to be independent wtih final HEP    Status On-going      PT LONG TERM GOAL #2   Title Pt will improve his left shoulder flexion to >/= 155 degrees with no pain reported.    Status On-going      PT LONG TERM GOAL #3   Title Pt will be able to perform over head lifting of 20# using bilateral UE's to place item on overhead shelf.    Status On-going      PT LONG TERM GOAL #4   Title Pt will be able  to perform left shoulder IR thumb up back to  >/= T8 with pain </= 2/10.    Status On-going      PT LONG TERM GOAL #5   Title Pt will improve his FOTO from 51% to 76%.    Status On-going                   Plan - 04/27/21 1016     Clinical Impression Statement Pt arriving reproting improvements in left shoulder since beginning therapy but still reporting increased pain at times with certain activities. Pt to consider extension of PT visits at next visit.    Personal Factors and Comorbidities Comorbidity 1    Comorbidities ADHD    Examination-Activity Limitations Lift;Reach Overhead    Examination-Participation Restrictions Yard Work;Laundry;Community Activity    Stability/Clinical Decision Making Stable/Uncomplicated    PT Frequency 1x / week    PT Treatment/Interventions ADLs/Self Care Home Management;Cryotherapy;Electrical Stimulation;DME Instruction;Ultrasound;Traction;Moist Heat;Iontophoresis 4mg /ml Dexamethasone;Functional mobility training;Therapeutic activities;Therapeutic exercise;Balance training;Neuromuscular re-education;Manual techniques;Patient/family education;Passive range of motion;Dry needling;Taping;Spinal Manipulations;Joint  Manipulations    PT Next Visit Plan manual PRN, strengthening    PT Home Exercise Plan Access Code:    Consulted and Agree with Plan of Care Patient             Patient will benefit from skilled therapeutic intervention in order to improve the following deficits and impairments:  Decreased strength, Increased edema, Postural dysfunction, Impaired flexibility, Decreased activity tolerance, Impaired UE functional use, Pain, Decreased range of motion  Visit Diagnosis: Acute pain of left shoulder  Muscle weakness (generalized)  Stiffness of left shoulder, not elsewhere classified     Problem List Patient Active Problem List   Diagnosis Date Noted   CMC arthritis, thumb, 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 [place], [place], [place] Phone: [phone]   Fax:  [phone]  Name: Juan Boone MRN: Juan Boone Date of Birth: Jul 10, 1991

## 2021-05-04 ENCOUNTER — Other Ambulatory Visit: Payer: Self-pay | Admitting: Family Medicine

## 2021-05-04 ENCOUNTER — Other Ambulatory Visit (HOSPITAL_COMMUNITY): Payer: Self-pay

## 2021-05-04 ENCOUNTER — Ambulatory Visit (INDEPENDENT_AMBULATORY_CARE_PROVIDER_SITE_OTHER): Payer: BC Managed Care – PPO | Admitting: Physical Therapy

## 2021-05-04 ENCOUNTER — Encounter: Payer: Self-pay | Admitting: Physical Therapy

## 2021-05-04 ENCOUNTER — Other Ambulatory Visit: Payer: Self-pay

## 2021-05-04 DIAGNOSIS — M25612 Stiffness of left shoulder, not elsewhere classified: Secondary | ICD-10-CM

## 2021-05-04 DIAGNOSIS — M25512 Pain in left shoulder: Secondary | ICD-10-CM | POA: Diagnosis not present

## 2021-05-04 DIAGNOSIS — M6281 Muscle weakness (generalized): Secondary | ICD-10-CM

## 2021-05-04 MED ORDER — BUPROPION HCL ER (XL) 150 MG PO TB24
ORAL_TABLET | Freq: Every day | ORAL | 0 refills | Status: DC
  Filled 2021-05-04: qty 30, 30d supply, fill #0

## 2021-05-04 NOTE — Therapy (Signed)
[facility] Physical Therapy [address] [place], [place], [address] Phone: [phone]   Fax:  [phone]  Physical Therapy Treatment Recertification  Patient Details  Name: Juan Boone MRN: [id number] Date of Birth: 1991-07-18 Referring Provider (PT): Ladona Mow, MD   Encounter Date: 05/04/2021   PT End of Session - 05/04/21 0820     Visit Number 6    Number of Visits 6    Date for PT Re-Evaluation 06/19/21    Authorization Type BCBS    Authorization Time Period Recert on 5/32/9924 at 6th visit, requested 3 additional visits for total of 6 week time period coming every other week.    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   Subjective Assessment - 05/04/21 0815     Subjective Pt arriving today reporting no pain at rest and 4-5/10 pain with left shoulder abduction.    Limitations Lifting;Writing;House hold activities    Patient Stated Goals Stop hurting    Currently in Pain? Yes    Pain Score 5     Pain Location Shoulder    Pain Orientation Left    Pain Descriptors / Indicators Sharp    Pain Type Acute pain    Pain Onset More than a month ago                [facility] PT Assessment - 05/04/21 0001       Assessment   Medical Diagnosis left shoulder pain M25.512    Referring Provider (PT) Ladona Mow, MD    Hand Dominance Right      Observation/Other Assessments   Focus on Therapeutic Outcomes (FOTO)  63%       Posture/Postural Control   Posture/Postural Control Postural limitations    Postural Limitations Rounded Shoulders;Forward head      AROM   Overall AROM Comments supine    AROM Assessment Site Shoulder    Right/Left Shoulder Left    Left Shoulder Flexion 160 Degrees    Left Shoulder ABduction 150 Degrees   with pain noted at insertion of deltoid   Left Shoulder Internal Rotation 85 Degrees    Left Shoulder External Rotation 72 Degrees      Strength   Left Shoulder Flexion 5/5    Left Shoulder Extension 5/5    Left Shoulder ABduction 4+/5    Left Shoulder Internal Rotation 4+/5    Left Shoulder External Rotation 4+/5                           [facility] Adult PT Treatment/Exercise - 05/04/21 0001       Shoulder Exercises: Prone   Other Prone Exercises modified planks progressing to full planks holding 20 seconds x 8   4 each way     Shoulder Exercises:  Standing   Diagonals Weight (lbs) BATCA 5# 2x15 D1 and D2    Other Standing Exercises BATCA 5# flexion bilateral UE's 2x15    Other Standing Exercises Discused shoulder stability exercises in weight bear positions, begining with wall walks in push up position and progressing to wall push ups and then floor push ups for pt's HEP      Shoulder Exercises: Pulleys   Flexion 2 minutes    ABduction 2 minutes      Shoulder Exercises: ROM/Strengthening   UBE (Upper Arm Bike) L4 x 6 minutes    Lat Pull Limitations 25# 2x15    Cybex Row Limitations 35# 2x15      Shoulder Exercises: Stretch   Other Shoulder Stretches abduction door stretch x 2 holding 20-30 seconds each      Manual Therapy   Manual therapy comments precussion to deltoid, infraspinatus, and upper trap                          PT Long Term Goals - 05/04/21 0830       PT LONG TERM GOAL #1   Title Pt to be independent wtih final HEP    Target Date 06/19/21      PT LONG TERM GOAL #2   Title Pt will improve his left shoulder flexion to  >/= 155 degrees with no pain reported.    Baseline 05/04/2021, 160 degrees no pain    Status Achieved      PT LONG TERM GOAL #3   Title Pt will be able to perform over head lifting of 20# using bilateral UE's to place item on overhead shelf.    Status Achieved      PT LONG TERM GOAL #4   Title Pt will be able to perform left shoulder IR thumb up back to  >/= T8 with pain </= 2/10.    Baseline able to touch T6 with his left thumb    Status Achieved      PT LONG TERM GOAL #5   Title Pt will improve his FOTO from 51% to 76%.    Status On-going    Target Date 06/19/21      Additional Long Term Goals   Additional Long Term Goals Yes      PT LONG TERM GOAL #6   Title Pt will be able to perform left shoulder abduction with no pain reported.    Time 6    Period Weeks    Status New    Target Date 06/19/21                   Plan - 05/04/21 0908     Clinical Impression Statement Pt arriving reporting no pain at rest but stating with left shoulder abduction his pain can increase to 5/10. Pt has met 3/5 of his LTG's. New LTG added for shoulder abduction. I am requesting 3 additional visits every other week for 6 weeks to progress toward pt's maximimal function.    Personal Factors and Comorbidities Comorbidity 1    Comorbidities ADHD    Examination-Activity Limitations Lift;Reach Overhead    Examination-Participation Restrictions Yard Work;Laundry;Community Activity    Stability/Clinical Decision Making Stable/Uncomplicated    Rehab Potential Good    PT Frequency Biweekly    PT Duration 6 weeks    PT Treatment/Interventions ADLs/Self Care Home Management;Cryotherapy;Electrical Stimulation;DME Instruction;Ultrasound;Traction;Moist Heat;Iontophoresis 54m/ml Dexamethasone;Functional mobility training;Therapeutic activities;Therapeutic exercise;Balance training;Neuromuscular re-education;Manual techniques;Patient/family education;Passive range of motion;Dry needling;Taping;Spinal  Manipulations;Joint Manipulations    PT Next Visit Plan manual PRN, strengthening, progress weightbearing activities    PT Home Exercise Plan Access Code: [email or web address]    [email or web address] and Agree with Plan of Care Patient             Patient will benefit from skilled therapeutic intervention in order to improve the following deficits and impairments:  Decreased strength, Increased edema, Postural dysfunction, Impaired flexibility, Decreased activity tolerance, Impaired UE functional use, Pain, Decreased range of motion  Visit Diagnosis: Acute pain of left shoulder  Muscle weakness (generalized)  Stiffness of left shoulder, not elsewhere classified     Problem List Patient Active Problem List   Diagnosis Date Noted   CMC arthritis, thumb, 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

## 2021-05-11 ENCOUNTER — Encounter: Payer: BC Managed Care – PPO | Admitting: Physical Therapy

## 2021-05-18 ENCOUNTER — Encounter: Payer: Self-pay | Admitting: Physical Therapy

## 2021-05-18 ENCOUNTER — Ambulatory Visit (INDEPENDENT_AMBULATORY_CARE_PROVIDER_SITE_OTHER): Payer: BC Managed Care – PPO | Admitting: Physical Therapy

## 2021-05-18 ENCOUNTER — Other Ambulatory Visit: Payer: Self-pay

## 2021-05-18 DIAGNOSIS — M6281 Muscle weakness (generalized): Secondary | ICD-10-CM

## 2021-05-18 DIAGNOSIS — M25612 Stiffness of left shoulder, not elsewhere classified: Secondary | ICD-10-CM | POA: Diagnosis not present

## 2021-05-18 DIAGNOSIS — M25512 Pain in left shoulder: Secondary | ICD-10-CM

## 2021-05-18 NOTE — Therapy (Signed)
[facility] [address] [place], [place], [address] Phone: [phone]   Fax:  [phone]  Physical Therapy Treatment  Patient Details  Name: Juan Boone MRN: [id number] Date of Birth: [birth year] Referring Provider (PT): Ladona Mow, MD   Encounter Date: 05/18/2021   PT End of Session - 05/18/21 0805     Visit Number 7    Number of Visits 9    Date for PT Re-Evaluation 06/19/21    Authorization Type BCBS    Authorization Time Period Recert on 04/24/3845 at 6th visit, requested 3 additional visits for total of 6 week time period coming every other week.    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   Subjective Assessment - 05/18/21 0806     Subjective Pt arriving today reporting 1-2/10 pain in left shoulder. Pt stating he has been helping install a fence so there was a "twinge" last night, but it feels better this morning.    Limitations Lifting;Writing;House hold activities    Patient Stated Goals Stop hurting    Currently in Pain? Yes    Pain Score 2     Pain Location Shoulder    Pain Orientation Left    Pain Descriptors / Indicators Sore    Pain Type Acute pain    Pain Onset More than a month ago                [facility] PT Assessment - 05/18/21 0001       Assessment   Medical Diagnosis left shoulder pain M25.512    Referring Provider (PT) Ladona Mow, MD    Hand Dominance Right      AROM    Overall AROM Comments supine    AROM Assessment Site Shoulder    Right/Left Shoulder Left    Left Shoulder Flexion 160 Degrees    Left Shoulder ABduction 150 Degrees    Left Shoulder Internal Rotation 85 Degrees    Left Shoulder External Rotation 72 Degrees                           [facility] Adult PT Treatment/Exercise - 05/18/21 0001       Shoulder Exercises: Prone   Other Prone Exercises planks holding 30 seconds x elbows extended, tapping 2 balance pods in plank position holding 15 seconds x 6 trials with 30 second rest break   first time 15 taps 916 rxn time, 6th trial 22 taps and 540 rxn time   Other Prone Exercises W's, and T's 2x10      Shoulder Exercises: Standing   Other Standing Exercises lifting 20# kettle bell onto shoulder height shelf. x 10    Other Standing Exercises wall walks with green theraband      Shoulder Exercises: ROM/Strengthening  UBE (Upper Arm Bike) L5 x 6 minutes    Lat Pull Limitations 35# 2x15    Cybex Row Limitations 45# 2x15      Shoulder Exercises: Stretch   Other Shoulder Stretches abduction door stretch x2 holding 30 seconds      Manual Therapy   Manual therapy comments STM, trigger point release to left supraspinatus and deltoid                          PT Long Term Goals - 05/18/21 0839       PT LONG TERM GOAL #1   Title Pt to be independent wtih final HEP    Status On-going      PT LONG TERM GOAL #2   Title Pt will improve his left shoulder flexion to >/= 155 degrees with no pain reported.    Status Achieved      PT LONG TERM GOAL #3   Title Pt will be able to perform over head lifting of 20# using bilateral UE's to place item on overhead shelf.    Status Achieved      PT LONG TERM GOAL #4   Title Pt will be able to perform left shoulder IR thumb up back to  >/= T8 with pain </= 2/10.    Baseline able to touch T6 with his left thumb    Status Achieved      PT LONG TERM GOAL #5   Title Pt will  improve his FOTO from 51% to 76%.    Status On-going      PT LONG TERM GOAL #6   Title Pt will be able to perform left shoulder abduction with no pain reported.    Status On-going                   Plan - 05/18/21 5320     Clinical Impression Statement Pt tolerating exercises well with increased weights and resistance and discussed building HEP. Pt still reporting episodes of sharp pain with more abduction and extension. Pt reporting "pinching with attempt at prone "Y's". Pt had no problems with T's and W's. Continue skilled PT to maximize funciton and progress toward goals not met.    Personal Factors and Comorbidities Comorbidity 1    Comorbidities ADHD    Examination-Activity Limitations Lift;Reach Overhead    Examination-Participation Restrictions Yard Work;Laundry;Community Activity    Stability/Clinical Decision Making Stable/Uncomplicated    Rehab Potential Good    PT Frequency Biweekly    PT Treatment/Interventions ADLs/Self Care Home Management;Cryotherapy;Electrical Stimulation;DME Instruction;Ultrasound;Traction;Moist Heat;Iontophoresis 3m/ml Dexamethasone;Functional mobility training;Therapeutic activities;Therapeutic exercise;Balance training;Neuromuscular re-education;Manual techniques;Patient/family education;Passive range of motion;Dry needling;Taping;Spinal Manipulations;Joint Manipulations    PT Next Visit Plan manual PRN, strengthening, progress weightbearing activities    PT Home Exercise Plan Access Code: [email or web address]   Consulted and Agree with Plan of Care Patient             Patient will benefit from skilled therapeutic intervention in order to improve the following deficits and impairments:  Decreased strength, Increased edema, Postural dysfunction, Impaired flexibility, Decreased activity tolerance, Impaired UE functional use, Pain, Decreased range of motion  Visit Diagnosis: Acute pain of left shoulder  Muscle weakness (generalized)  Stiffness of  left shoulder, not elsewhere classified     Problem List Patient Active Problem List   Diagnosis Date Noted   CMC arthritis, thumb, degenerative 03/13/2021   Attention deficit disorder (ADD) in adult    Gluten intolerance  09/09/2016   Migraine     Oretha Caprice, PT, MPT 05/18/2021, 8:47 AM  [facility] Physical Therapy [address] [place], [place], [address] Phone: [phone]   Fax:  [phone]  Name: Juan Boone MRN: [id number] Date of Birth: August 21, 1990

## 2021-06-01 ENCOUNTER — Encounter: Payer: BC Managed Care – PPO | Admitting: Physical Therapy

## 2021-06-11 ENCOUNTER — Other Ambulatory Visit (HOSPITAL_COMMUNITY): Payer: Self-pay

## 2021-06-11 ENCOUNTER — Other Ambulatory Visit: Payer: Self-pay | Admitting: Family Medicine

## 2021-06-12 NOTE — Telephone Encounter (Signed)
Last OV- 10/08/2019 Last refill: 03/26/2021 Disp-30 tabs/ refills 0

## 2021-06-15 ENCOUNTER — Other Ambulatory Visit (HOSPITAL_COMMUNITY): Payer: Self-pay

## 2021-06-15 ENCOUNTER — Other Ambulatory Visit: Payer: Self-pay

## 2021-06-15 ENCOUNTER — Ambulatory Visit (INDEPENDENT_AMBULATORY_CARE_PROVIDER_SITE_OTHER): Payer: BC Managed Care – PPO | Admitting: Physical Therapy

## 2021-06-15 ENCOUNTER — Encounter: Payer: Self-pay | Admitting: Physical Therapy

## 2021-06-15 DIAGNOSIS — M6281 Muscle weakness (generalized): Secondary | ICD-10-CM | POA: Diagnosis not present

## 2021-06-15 DIAGNOSIS — M25612 Stiffness of left shoulder, not elsewhere classified: Secondary | ICD-10-CM

## 2021-06-15 DIAGNOSIS — M25512 Pain in left shoulder: Secondary | ICD-10-CM | POA: Diagnosis not present

## 2021-06-15 MED ORDER — BUPROPION HCL ER (XL) 150 MG PO TB24
ORAL_TABLET | Freq: Every day | ORAL | 0 refills | Status: DC
  Filled 2021-06-15: qty 30, 30d supply, fill #0

## 2021-06-15 NOTE — Patient Instructions (Signed)
Access Code: [email or web address] URL: [email or web address] Date: 06/15/2021 Prepared by: Moshe Cipro  Exercises Standing Shoulder Row with Anchored Resistance - 2-3 x daily - 7 x weekly - 2 sets - 10 reps Standing Shoulder Internal Rotation Stretch with Towel - 2-3 x daily - 7 x weekly - 2 sets - 10 reps - 5 seconds hold Standing Shoulder Flexion to 90 Degrees with Dumbbells - 1 x daily - 7 x weekly - 3 sets - 10 reps Shoulder Abduction with Dumbbells - Thumbs Up - 1 x daily - 7 x weekly - 3 sets - 10 reps Standing Shoulder Diagonal Horizontal Abduction 60/120 Degrees with Resistance - 1 x daily - 7 x weekly - 3 sets - 10 reps Full Plank with Shoulder Taps - 1 x daily - 7 x weekly - 3 sets - 10 reps

## 2021-06-15 NOTE — Therapy (Signed)
[facility] [address] [place], [place], [address] Phone: [phone]   Fax:  [phone]  Physical Therapy Treatment/Discharge Summary  Patient Details  Name: Juan Boone MRN: [id number] Date of Birth: 03/21/1991 Referring Provider (PT): Corey, Evan, MD   Encounter Date: 06/15/2021   PT End of Session - 06/15/21 0825     Visit Number 8    Authorization Type BCBS    PT Start Time 0803    PT Stop Time 0821    PT Time Calculation (min) 18 min    Activity Tolerance Patient tolerated treatment well    Behavior During Therapy WFL for tasks assessed/performed             Past Medical History:  Diagnosis Date   ADD (attention deficit disorder)    CMC arthritis, thumb, 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   Subjective Assessment - 06/15/21 0805     Subjective feels like shoulder is doing better; still has occasional pain but overall back to all activities.    Limitations Lifting;Writing;House hold activities    Patient Stated Goals Stop hurting    Currently in Pain? No/denies                [facility] PT Assessment - 06/15/21 0826       Assessment   Medical Diagnosis left shoulder pain M25.512    Referring Provider (PT) Corey, Evan, MD      Observation/Other Assessments   Focus on Therapeutic Outcomes (FOTO)  72      AROM   Overall AROM Comments performed seated abduction - full ROM no pain just c/o tightness                           [facility] Adult PT Treatment/Exercise - 06/15/21 0805       Exercises   Exercises Other Exercises    Other Exercises  discussed current home program - pt doing more strengthening than shown on HEP so updated HEP to reflect accurate home exercises       Shoulder Exercises: ROM/Strengthening   UBE (Upper Arm Bike) L5 x 6 minutes                     PT Education - 06/15/21 0825     Education Details updated HEP to reflect his current home program    Person(s) Educated Patient    Methods Explanation    Comprehension Verbalized understanding                 PT Long Term Goals - 06/15/21 0826       PT LONG TERM GOAL #1   Title Pt to be independent wtih final HEP    Status Achieved      PT LONG TERM GOAL #2   Title Pt will improve his left shoulder flexion to >/= 155 degrees with no pain reported.    Status Achieved      PT LONG TERM GOAL #3   Title Pt will be able to perform over head lifting of 20# using bilateral UE's to place item on overhead shelf.    Status Achieved        PT LONG TERM GOAL #4   Title Pt will be able to perform left shoulder IR thumb up back to  >/= T8 with pain </= 2/10.    Baseline able to touch T6 with his left thumb    Status Achieved      PT LONG TERM GOAL #5   Title Pt will improve his FOTO from 51% to 76%.    Baseline 11/7: improved to 72    Status Not Met      PT LONG TERM GOAL #6   Title Pt will be able to perform left shoulder abduction with no pain reported.    Status Achieved                   Plan - 06/15/21 0828     Clinical Impression Statement Pt has met/nearly met all goals and is please with progress.  FOTO score improved 21% but not quite to goal of 24% improved.  Will d/c PT today as he has good HEP to continue at home.    Personal Factors and Comorbidities Comorbidity 1    Comorbidities ADHD    Examination-Activity Limitations Lift;Reach Overhead    Examination-Participation Restrictions Yard Work;Laundry;Community Activity    Stability/Clinical Decision Making Stable/Uncomplicated    Rehab Potential Good    PT Frequency Biweekly    PT Treatment/Interventions ADLs/Self Care Home Management;Cryotherapy;Electrical Stimulation;DME  Instruction;Ultrasound;Traction;Moist Heat;Iontophoresis 4mg/ml Dexamethasone;Functional mobility training;Therapeutic activities;Therapeutic exercise;Balance training;Neuromuscular re-education;Manual techniques;Patient/family education;Passive range of motion;Dry needling;Taping;Spinal Manipulations;Joint Manipulations    PT Next Visit Plan d/c PT today    PT Home Exercise Plan Access Code: [email or web address]    Consulted and Agree with Plan of Care Patient             Patient will benefit from skilled therapeutic intervention in order to improve the following deficits and impairments:  Decreased strength, Increased edema, Postural dysfunction, Impaired flexibility, Decreased activity tolerance, Impaired UE functional use, Pain, Decreased range of motion  Visit Diagnosis: Acute pain of left shoulder  Muscle weakness (generalized)  Stiffness of left shoulder, not elsewhere classified     Problem List Patient Active Problem List   Diagnosis Date Noted   CMC arthritis, thumb, 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 Patient goals were partially met. Patient is being discharged due to being pleased with the current functional level.  Stephanie F Matthews, PT, DPT 06/15/21 8:30 AM  [facility] [address] [place], , [address] Phone: [phone]   Fax:   [phone]    

## 2021-06-15 NOTE — Telephone Encounter (Signed)
Please schedule pt for f/u for further refills. 30 day supply given.

## 2021-07-23 ENCOUNTER — Other Ambulatory Visit (HOSPITAL_COMMUNITY): Payer: Self-pay

## 2021-07-23 ENCOUNTER — Other Ambulatory Visit: Payer: Self-pay | Admitting: Family Medicine

## 2021-07-27 ENCOUNTER — Other Ambulatory Visit (HOSPITAL_COMMUNITY): Payer: Self-pay

## 2021-07-29 ENCOUNTER — Other Ambulatory Visit: Payer: Self-pay | Admitting: Family Medicine

## 2021-07-29 ENCOUNTER — Encounter: Payer: Self-pay | Admitting: Family Medicine

## 2021-07-29 NOTE — Telephone Encounter (Signed)
Message sent thru MyChart for pt to contact the office to schedule a F/U appt.

## 2021-07-30 ENCOUNTER — Other Ambulatory Visit (HOSPITAL_COMMUNITY): Payer: Self-pay

## 2021-07-30 MED ORDER — BUPROPION HCL ER (XL) 150 MG PO TB24: ORAL_TABLET | Freq: Every day | ORAL | 0 refills | Status: DC

## 2021-07-30 NOTE — Telephone Encounter (Signed)
Maddy, please call pt and schedule a follow up per Dr. Durene Cal and let me know when scheduled and I will send in Rx.

## 2021-07-30 NOTE — Telephone Encounter (Signed)
Patient is scheduled for 08/24/21 with Dr. Durene Cal

## 2021-08-19 ENCOUNTER — Encounter: Payer: Self-pay | Admitting: Family Medicine

## 2021-08-21 NOTE — Progress Notes (Signed)
Phone [phone] Virtual visit via Video note   Subjective:  Chief complaint: Chief Complaint  Patient presents with   Follow-up    Pt would like to have Bupropion refilled on a consistent basis.    This visit type was conducted due to national recommendations for restrictions regarding the COVID-19 Pandemic (e.g. social distancing).  This format is felt to be most appropriate for this patient at this time balancing risks to patient and risks to population by having him in for in person visit.  No physical exam was performed (except for noted visual exam or audio findings with Telehealth visits).    Our team/I connected with [name] at  4:00 PM EST by a video enabled telemedicine application ([email or web address] or caregility through epic) and verified that I am speaking with the correct person using two identifiers.  Location patient: [facility] Location provider: [facility], office Persons participating in the virtual visit:  patient  Our team/I discussed the limitations of evaluation and management by telemedicine and the availability of in person appointments. In light of current covid-19 pandemic, patient also understands that we are trying to protect them by minimizing in office contact if at all possible.  The patient expressed consent for telemedicine visit and agreed to proceed. Patient understands insurance will be billed.   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     BIOTIN PO Take by mouth.     LACTASE ENZYME PO Take 1 packet by mouth 3 (three) times daily with meals as needed (lactose  intolerance).     Probiotic Product (PROBIOTIC PO) Take by mouth.     buPROPion (WELLBUTRIN XL) 150 MG 24 hr tablet Take 1 tablet (150 mg total) by mouth daily. 90 tablet 3   No current facility-administered medications for this visit.     Objective:  BP 131/77    Ht 5\' 10"  (1.778 m)    Wt 209 lb (94.8 kg)    BMI 29.99 kg/m  self reported vitals Gen: NAD, resting comfortably Lungs: nonlabored, normal respiratory rate  Skin: appears dry, no obvious rash     Assessment and Plan   #Social update- visiting [place] right now- wife's mom has chemo. Still at .  - doing [employer] motivational program for healthy eating with wife. Working to eat healthier- blood pressure higher normal and hed liek to work this down. Exercise would help  #Adult ADD S: medication: has noted improved symptoms on wellbutrin 150 mg XR. No significant mistakes or missed items at work like previous.  -no SI. No depressive symptoms or anxiety.   History: see prior notes but diagnosed in 3rd grade originally.  A/P: doing very well- refilled for 1 year.    #Thumb Pain - early onset arthritis- not worsening but not getting worse. Bilateral.   Recommended follow up: 4-6 months for physical and labs  Lab/Order associations:   ICD-10-CM   1. Attention deficit disorder (ADD) in adult  F98.8     2. Skin cancer screening  Z12.83 Ambulatory referral to Dermatology     Meds ordered  this encounter  Medications   buPROPion (WELLBUTRIN XL) 150 MG 24 hr tablet    Sig: Take 1 tablet (150 mg total) by mouth daily.    Dispense:  90 tablet    Refill:  3   I,Jada Bradford,acting as a scribe for Tana Conch, MD.,have documented all relevant documentation on the behalf of Tana Conch, MD,as directed by  Tana Conch, MD while in the presence of Tana Conch, MD.  I, Tana Conch, MD, have reviewed all documentation for this visit. The documentation on 08/24/21 for the exam, diagnosis, procedures, and  orders are all accurate and complete.  Return precautions advised.  Tana Conch, MD

## 2021-08-24 ENCOUNTER — Other Ambulatory Visit: Payer: Self-pay

## 2021-08-24 ENCOUNTER — Telehealth (INDEPENDENT_AMBULATORY_CARE_PROVIDER_SITE_OTHER): Payer: BC Managed Care – PPO | Admitting: Family Medicine

## 2021-08-24 ENCOUNTER — Encounter: Payer: Self-pay | Admitting: Family Medicine

## 2021-08-24 VITALS — BP 131/77 | Ht 70.0 in | Wt 209.0 lb

## 2021-08-24 DIAGNOSIS — F988 Other specified behavioral and emotional disorders with onset usually occurring in childhood and adolescence: Secondary | ICD-10-CM | POA: Diagnosis not present

## 2021-08-24 DIAGNOSIS — Z1283 Encounter for screening for malignant neoplasm of skin: Secondary | ICD-10-CM | POA: Diagnosis not present

## 2021-08-24 MED ORDER — BUPROPION HCL ER (XL) 150 MG PO TB24: 150.0000 mg | ORAL_TABLET | Freq: Every day | ORAL | 3 refills | Status: DC

## 2022-01-18 ENCOUNTER — Encounter: Payer: Self-pay | Admitting: Family Medicine

## 2022-01-18 ENCOUNTER — Other Ambulatory Visit: Payer: Self-pay | Admitting: Family Medicine

## 2022-01-18 MED ORDER — BUPROPION HCL ER (XL) 150 MG PO TB24: 150.0000 mg | ORAL_TABLET | Freq: Every day | ORAL | 11 refills | Status: DC

## 2022-01-18 NOTE — Telephone Encounter (Signed)
See below

## 2022-03-16 ENCOUNTER — Encounter: Payer: Self-pay | Admitting: Family Medicine

## 2022-05-03 ENCOUNTER — Encounter: Payer: Self-pay | Admitting: *Deleted

## 2022-07-22 ENCOUNTER — Encounter: Payer: Self-pay | Admitting: *Deleted

## 2022-10-19 ENCOUNTER — Encounter: Payer: Self-pay | Admitting: Family Medicine

## 2022-10-19 ENCOUNTER — Other Ambulatory Visit: Payer: Self-pay

## 2022-10-19 MED ORDER — BUPROPION HCL ER (XL) 150 MG PO TB24: 150.0000 mg | ORAL_TABLET | Freq: Every day | ORAL | 0 refills | Status: DC

## 2022-11-22 ENCOUNTER — Ambulatory Visit (INDEPENDENT_AMBULATORY_CARE_PROVIDER_SITE_OTHER): Payer: BC Managed Care – PPO | Admitting: Family Medicine

## 2022-11-22 ENCOUNTER — Ambulatory Visit: Payer: BC Managed Care – PPO | Admitting: Family Medicine

## 2022-11-22 ENCOUNTER — Encounter: Payer: Self-pay | Admitting: Family Medicine

## 2022-11-22 VITALS — BP 128/78 | HR 48 | Temp 98.0°F | Ht 70.0 in | Wt 196.4 lb

## 2022-11-22 DIAGNOSIS — Z0000 Encounter for general adult medical examination without abnormal findings: Secondary | ICD-10-CM | POA: Diagnosis not present

## 2022-11-22 DIAGNOSIS — Z13220 Encounter for screening for lipoid disorders: Secondary | ICD-10-CM

## 2022-11-22 DIAGNOSIS — Z114 Encounter for screening for human immunodeficiency virus [HIV]: Secondary | ICD-10-CM

## 2022-11-22 DIAGNOSIS — Z1159 Encounter for screening for other viral diseases: Secondary | ICD-10-CM

## 2022-11-22 DIAGNOSIS — Z130 Encounter for screening for diseases of the blood and blood-forming organs and certain disorders involving the immune mechanism: Secondary | ICD-10-CM | POA: Diagnosis not present

## 2022-11-22 LAB — CBC WITH DIFFERENTIAL/PLATELET
Basophils Absolute: 0.1 10*3/uL (ref 0.0–0.1)
Basophils Relative: 1.1 % (ref 0.0–3.0)
Eosinophils Absolute: 0.1 10*3/uL (ref 0.0–0.7)
Eosinophils Relative: 2.4 % (ref 0.0–5.0)
HCT: 44.7 % (ref 39.0–52.0)
Hemoglobin: 14.8 g/dL (ref 13.0–17.0)
Lymphocytes Relative: 31.6 % (ref 12.0–46.0)
Lymphs Abs: 1.7 10*3/uL (ref 0.7–4.0)
MCHC: 33.2 g/dL (ref 30.0–36.0)
MCV: 88 fl (ref 78.0–100.0)
Monocytes Absolute: 0.8 10*3/uL (ref 0.1–1.0)
Monocytes Relative: 15.1 % — ABNORMAL HIGH (ref 3.0–12.0)
Neutro Abs: 2.6 10*3/uL (ref 1.4–7.7)
Neutrophils Relative %: 49.8 % (ref 43.0–77.0)
Platelets: 208 10*3/uL (ref 150.0–400.0)
RBC: 5.08 Mil/uL (ref 4.22–5.81)
RDW: 13.1 % (ref 11.5–15.5)
WBC: 5.3 10*3/uL (ref 4.0–10.5)

## 2022-11-22 LAB — LIPID PANEL
Cholesterol: 197 mg/dL (ref 0–200)
HDL: 54.7 mg/dL (ref >39.00)
LDL Cholesterol: 127 mg/dL — ABNORMAL HIGH (ref 0–99)
NonHDL: 141.85
Total CHOL/HDL Ratio: 4
Triglycerides: 74 mg/dL (ref 0.0–149.0)
VLDL: 14.8 mg/dL (ref 0.0–40.0)

## 2022-11-22 LAB — COMPREHENSIVE METABOLIC PANEL
ALT: 23 U/L (ref 0–53)
AST: 21 U/L (ref 0–37)
Albumin: 4.6 g/dL (ref 3.5–5.2)
Alkaline Phosphatase: 58 U/L (ref 39–117)
BUN: 18 mg/dL (ref 6–23)
CO2: 30 mEq/L (ref 19–32)
Calcium: 9.6 mg/dL (ref 8.4–10.5)
Chloride: 103 mEq/L (ref 96–112)
Creatinine, Ser: 1.07 mg/dL (ref 0.40–1.50)
GFR: 90.94 mL/min (ref >60.00)
Glucose, Bld: 93 mg/dL (ref 70–99)
Potassium: 4.3 mEq/L (ref 3.5–5.1)
Sodium: 141 mEq/L (ref 135–145)
Total Bilirubin: 0.5 mg/dL (ref 0.2–1.2)
Total Protein: 6.7 g/dL (ref 6.0–8.3)

## 2022-11-22 MED ORDER — BUPROPION HCL ER (XL) 150 MG PO TB24: 150.0000 mg | ORAL_TABLET | Freq: Every day | ORAL | 3 refills | Status: DC

## 2022-11-22 NOTE — Progress Notes (Signed)
Phone: [phone]    Subjective:  Patient presents today for their annual physical. Chief complaint-noted.   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     Medications- reviewed and updated Current Outpatient Medications  Medication Sig Dispense Refill   B Complex Vitamins (B COMPLEX PO) Take by mouth.     BIOTIN PO Take by mouth.     LACTASE ENZYME PO Take 1 packet by mouth 3 (three) times daily with meals as needed (lactose intolerance).     Probiotic Product (PROBIOTIC PO) Take by mouth.     buPROPion (WELLBUTRIN XL) 150 MG 24 hr tablet Take 1 tablet (150 mg total) by mouth daily. 90 tablet 3   No current facility-administered medications for this visit.    Allergies-reviewed and updated Allergies  Allergen Reactions   Gluten Meal Other (See Comments)    Gi upset   Lactose Intolerance (Gi) Diarrhea and Other (See Comments)    Gi upset   Aleve [Naproxen Sodium] Other (See Comments)    GI upset    Social History   Social History Narrative   Married. Expecting 1st child March 11 2023. 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 Oropharynx normal Neck: no thyromegaly CV: RRR no murmurs rubs or gallops Lungs: CTAB no crackles, wheeze, rhonchi Abdomen: soft/nontender/nondistended/normal bowel sounds. No rebound or guarding.  Ext: no edema Skin: warm, dry Neuro: grossly normal, moves all extremities, PERRLA    Assessment and Plan:  32 y.o. male presenting for annual physical.  Health Maintenance counseling: 1. Anticipatory guidance: Patient counseled regarding regular dental exams -advised q6 months, eye exams - no vision issues,  avoiding smoking and second hand smoke , limiting alcohol to 2 beverages per day, no illicit drugs.   2. Risk factor reduction:  Advised patient of need for regular exercise and diet rich and fruits and vegetables to reduce risk of heart attack and stroke.  Exercise- running  2-3 days a week and some body weight exercises with circuits.  Diet/weight management-ongoing gradual weight loss- congratulated his efforts. Got as low as 189 before wife got pregnant- e30m program has been helpful when engaged- does intermittent fasting- menu based and helpful and 1 day a week has cheat meal.  Wt Readings from Last 3 Encounters:  11/22/22 196 lb 6.4 oz (89.1 kg)  08/24/21 209 lb (94.8 kg)  03/13/21 216 lb 6.4 oz (98.2 kg)  3. Immunizations/screenings/ancillary studies- screen HIV and Hepatitis C Virus (HCV) today. Recommended flu shot in fall and consider COVID shot before baby comes Immunization History  Administered Date(s) Administered   Fluad Quad(high Dose 65+) 05/26/2019   Influenza-Unspecified  08/26/2016   PFIZER(Purple Top)SARS-COV-2 Vaccination 10/08/2019, 11/08/2019   Tdap 09/09/2016  4. Prostate cancer screening- unknown family history-start age 52  5. Colon cancer screening - unknown family history other than history of Crohn's-no abdominal pain or blood in the stool and patient.  Had a colonoscopy in 2019 and plan was for 10-year repeat  6. Skin cancer screening/prevention- has visit on Friday. advised regular sunscreen use. 7. Testicular cancer screening- advised monthly self exams  8. STD screening- patient opts out- only active with [facility] 9. Smoking associated screening- never smoker  Status of chronic or acute concerns   # ADD in adult-has noted improvement with Wellbutrin 150 mg extended release.  Prior to treatment had some missed items at work and mistakes-this was significantly improved on medicine.  We opted to continue current medication and follow-up in 1 year - See prior notes but have been diagnosed in third grade originally -we gave goodrx card as price up to $50 recently from $15  # Thumb pain-notes bilateral thumb pain   #hyperlipidemia S: Medication: None-very mild elevations only Lab Results  Component Value Date   CHOL 162 02/07/2019   HDL 49.00 02/07/2019   LDLCALC 92 02/07/2019   TRIG 105.0 02/07/2019   CHOLHDL 3 02/07/2019   A/P: Certainly not in range for medicine-offered update today    Recommended follow up: Return in about 1 year (around 11/22/2023) for physical or sooner if needed.Schedule b4 you leave.  Lab/Order associations: fasting   ICD-10-CM   1. Preventative health care  Z00.00 HIV Antibody (routine testing w rflx)    Hepatitis C antibody    CBC with Differential/Platelet    Comprehensive metabolic panel    Lipid panel    2. Screening for hyperlipidemia  Z13.220 Comprehensive metabolic panel    Lipid panel    3. Screening for HIV (human immunodeficiency virus)  Z11.4 HIV Antibody (routine testing w rflx)    4. Encounter for  hepatitis C screening test for low risk patient  Z11.59 Hepatitis C antibody    5. Screening for deficiency anemia  Z13.0 CBC with Differential/Platelet      Meds ordered this encounter  Medications   buPROPion (WELLBUTRIN XL) 150 MG 24 hr tablet    Sig: Take 1 tablet (150 mg total) by mouth daily.    Dispense:  90 tablet    Refill:  3    Return precautions advised.   Tana Conch, MD

## 2022-11-22 NOTE — Patient Instructions (Addendum)
Update dental visit  Try goodrx for wellbutrin  Please stop by lab before you go If you have mychart- we will send your results within 3 business days of [place] receiving them.  If you do not have mychart- we will call you about results within 5 business days of [place] receiving them.  *please also note that you will see labs on mychart as soon as they post. I will later go in and write notes on them- will say "notes from Dr. Durene Cal"   Recommended follow up: Return in about 1 year (around 11/22/2023) for physical or sooner if needed.Schedule b4 you leave.

## 2022-11-23 LAB — HEPATITIS C ANTIBODY: Hepatitis C Ab: NONREACTIVE

## 2022-11-23 LAB — HIV ANTIBODY (ROUTINE TESTING W REFLEX): HIV 1&2 Ab, 4th Generation: NONREACTIVE

## 2022-11-26 DIAGNOSIS — I788 Other diseases of capillaries: Secondary | ICD-10-CM | POA: Diagnosis not present

## 2022-11-26 DIAGNOSIS — L812 Freckles: Secondary | ICD-10-CM | POA: Diagnosis not present

## 2023-01-27 ENCOUNTER — Encounter: Payer: Self-pay | Admitting: Family Medicine

## 2023-03-17 ENCOUNTER — Encounter: Payer: Self-pay | Admitting: Family Medicine

## 2023-03-17 ENCOUNTER — Telehealth: Payer: BC Managed Care – PPO | Admitting: Family Medicine

## 2023-03-17 ENCOUNTER — Telehealth: Payer: Self-pay | Admitting: Family Medicine

## 2023-03-17 NOTE — Telephone Encounter (Signed)
There is actually a fair amount to go over here though majority of this is related to whether the dog has rabies or not- would he like to do virtual at 1 pm if insurance allows?

## 2023-03-17 NOTE — Telephone Encounter (Signed)
Patient states let out dog this morning and noticed it came in contact with a possible rabid possum. States the contact was brief but they called the vet and checked on what to do. Since he and his wife came in contact with the dog and currently have a new born, the vet recommended they reach out to PCP to see if there are any other recommendations. Please Advise.

## 2023-03-17 NOTE — Telephone Encounter (Signed)
Patient is scheduled for 4pm virtual today to discuss further.

## 2023-03-17 NOTE — Telephone Encounter (Signed)
See below

## 2023-03-17 NOTE — Telephone Encounter (Signed)
FYI

## 2023-03-17 NOTE — Patient Instructions (Signed)
Let us know if you get a flu or COVID vaccine this fall.

## 2023-03-18 NOTE — Progress Notes (Signed)
Patient scheduled visit.  My team checked him in and even connected with him but by time of visit-visit had been canceled it appears by patient-we are available if he changes his mind

## 2023-09-26 ENCOUNTER — Ambulatory Visit (INDEPENDENT_AMBULATORY_CARE_PROVIDER_SITE_OTHER): Payer: BC Managed Care – PPO | Admitting: Family Medicine

## 2023-09-26 ENCOUNTER — Encounter: Payer: Self-pay | Admitting: Family Medicine

## 2023-09-26 ENCOUNTER — Telehealth: Payer: Self-pay | Admitting: Family Medicine

## 2023-09-26 DIAGNOSIS — R059 Cough, unspecified: Secondary | ICD-10-CM | POA: Diagnosis not present

## 2023-09-26 DIAGNOSIS — J111 Influenza due to unidentified influenza virus with other respiratory manifestations: Secondary | ICD-10-CM | POA: Diagnosis not present

## 2023-09-26 LAB — POCT RAPID STREP A (OFFICE): Rapid Strep A Screen: NEGATIVE

## 2023-09-26 LAB — POCT INFLUENZA A/B
Influenza A, POC: POSITIVE — AB
Influenza B, POC: NEGATIVE

## 2023-09-26 LAB — POC COVID19 BINAXNOW: SARS Coronavirus 2 Ag: NEGATIVE

## 2023-09-26 MED ORDER — OSELTAMIVIR PHOSPHATE 75 MG PO CAPS: 75.0000 mg | ORAL_CAPSULE | Freq: Two times a day (BID) | ORAL | 0 refills | Status: DC

## 2023-09-26 NOTE — Patient Instructions (Addendum)
 Flu-like illness: History and exam today are suggestive of viral process. Patients influenza test was positive.  Pretest probability of influenza was high. High risk condition: none  Patient will be treated with Tamiflu- aware more limited benefit after 48 hours Symptomatic treatment with: dayquil, tylenol or ibuprofen  Finally, we reviewed reasons to return to care including if symptoms worsen or persist or new concerns arise. - we did discuss potential for secondary bacterial infection- if symptoms particularly sinus pressure last over 10 days or if improves then worsens- I would want to have that information- he will reach out  Recommended follow up: Return for as needed for new, worsening, persistent symptoms.

## 2023-09-26 NOTE — Telephone Encounter (Signed)
Please schedule with available provider.

## 2023-09-26 NOTE — Progress Notes (Signed)
 Phone [phone] In person visit   Subjective:   Juan Boone is a 33 y.o. year old very pleasant male patient who presents for/with See problem oriented charting Chief Complaint  Patient presents with   Sore Throat   Cough    Patient has had cough greenish color for about 2 to 3 days.Nausea throwing up started on Friday 09/23/2023 cough congestion came on Saturday states that son was tested on Saturday and is positive for flu. Cold chili temp up to 99.5.Sore throat as well due from the coughing.Mild headache comes and goes. Really tired.   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 10 capsule 0   B Complex Vitamins (B COMPLEX PO) Take by mouth.     BIOTIN PO Take by mouth.     buPROPion (WELLBUTRIN XL) 150 MG 24 hr tablet Take 1 tablet (150 mg total) by mouth daily. 90 tablet 3   LACTASE ENZYME PO Take 1 packet by mouth 3 (three) times daily with meals as needed (lactose intolerance).     Probiotic Product (PROBIOTIC PO) Take by mouth.     No current facility-administered medications for this visit.     Objective:  BP 136/80   Pulse 66   Temp (!) 97 F (36.1 C) (Temporal)   Ht 5\' 10"  (1.778 m)   Wt 185 lb 6.4 oz (84.1 kg)   SpO2 95%   BMI 26.60 kg/m  Gen: NAD, resting comfortably Tympanic membrane normal, pharynx with some drainage, nasal turbinates erythematous and edematous with limited discharge CV: RRR no murmurs rubs or gallops Lungs: CTAB no crackles, wheeze, rhonchi Abdomen: soft/nontender/nondistended/normal bowel sounds. No  rebound or guarding.  Ext: no edema Skin: warm, dry Neuro: grossly normal, moves all extremities   Results for orders placed or performed in visit on 09/26/23 (from the past 24 hours)  POCT Influenza A/B     Status: Abnormal   Collection Time: 09/26/23 10:41 AM  Result Value Ref Range   Influenza A, POC Positive (A) Negative   Influenza B, POC Negative Negative  POC COVID-19     Status: None   Collection Time: 09/26/23 10:43 AM  Result Value Ref Range   SARS Coronavirus 2 Ag Negative Negative  POCT rapid strep A     Status: None   Collection Time: 09/26/23 10:43 AM  Result Value Ref Range   Rapid Strep A Screen Negative Negative       Assessment and Plan   # Flu like symptoms  S: presents with including subjective fever and measures up to 99.5, body aches, chills. Poor sleep due to caring for child that has flu.  -other symptoms: cough with greens putum, nausea and some vomiting on Friday- no blood or bile. Upper chest hurting with cough. Some headaches and eye soreness. A lot of fatigue -started: friday -inside 48 hour treatment window if needed for tamiflu: no -high risk condition (children <5, adults >65, chronic pulmonary or cardiac condition, immunosuppression, pregnancy, nursing home resident, morbid obesity) :  no -symptoms are improving some today -previous treatments: acetaminophen, dayquil as needed  - patient did  receive flu shot this year 04/26/23 - positive sick contact;- daycare 2 weeks ago and exposed to flu   ROS-some loose stools. Some sinus pain. Mild shortness of breath  A/P:  Flu-like illness: History and exam today are suggestive of viral process. Patients influenza test was positive.  Pretest probability of influenza was high. High risk condition: none  Patient will be treated with Tamiflu- aware more limited benefit after 48 hours Symptomatic treatment with: dayquil, tylenol or ibuprofen  Finally, we reviewed reasons to return to care including if  symptoms worsen or persist or new concerns arise.  Recommended follow up: Return for as needed for new, worsening, persistent symptoms. Future Appointments  Date Time Provider Department Center  10/20/2023  8:30 AM Terri Piedra, DO [facility] None    Lab/Order associations:   ICD-10-CM   1. Flu  J11.1 POCT rapid strep A    2. Cough, unspecified type  R05.9 POCT Influenza A/B    POC COVID-19      Meds ordered this encounter  Medications   oseltamivir (TAMIFLU) 75 MG capsule    Sig: Take 1 capsule (75 mg total) by mouth 2 (two) times daily.    Dispense:  10 capsule    Refill:  0    Return precautions advised.  Tana Conch, MD

## 2023-09-26 NOTE — Telephone Encounter (Signed)
 Scheduled today with provider  Patient Name First: Juan Boone Gender: Male DOB: 31-Jan-1991 Age: 33 Y 8 M 18 D Return Phone Number: [phone] (Primary), [phone] (Secondary) Address: City/ State/ Zip: [place]  [address] Client [facility] - Human resources officer [facility] Night Provider Tana Conch- MD Contact Type Call Who Is Calling Patient / Member / Family / Caregiver Call Type Triage / Clinical Relationship To Patient Self Return Phone Number [phone] (Primary) Chief Complaint Vomiting Reason for Call Symptomatic / Request for Health Information Initial Comment Caller states his son tested positive for the Flu and he wants to know if he should take Tamiflu. Sx: vomiting, diarrhea, mucus in his throat; no blood in his vomited and he has urinated within the last 8 hours. Translation No Nurse Assessment Nurse: Unk Pinto, RN, Destiny Date/Time (Eastern Time): 09/24/2023 7:47:17 PM Confirm and document reason for call. If symptomatic, describe symptoms. ---Caller states son tested + for flu. Caller states he has body aches, cough, congestion, diarrhea, no fever, no vomit. Symptoms began yesterday approx. 11am. Does the patient have any new or worsening symptoms? ---Yes Will a triage be completed? ---Yes Related visit to physician within the last 2 weeks? ---No Does the PT have any chronic conditions? (i.e. diabetes, asthma, this includes High risk factors for pregnancy, etc.) ---No Is this a behavioral health or substance abuse call? ---No Guidelines Guideline Title Affirmed Question Affirmed Notes Nurse Date/Time Lamount Cohen Time) Influenza (Flu) - Seasonal Earache Loar, RN, Destiny 09/24/2023 7:49:21 PM Disp. Time Lamount Cohen Time) Disposition Final User 09/24/2023 7:54:05 PM See PCP within 24 Hours Yes Loar, RN, Destiny Final Disposition 09/24/2023 7:54:05 PM See PCP within 24 Hours Yes  Loar, RN, Destiny Caller Disagree/Comply Comply Caller Understands Yes PreDisposition Did not know what to do Care Advice Given Per Guideline SEE PCP WITHIN 24 HOURS: * IF OFFICE WILL BE CLOSED: You need to be seen within the next 24 hours. A clinic or an urgent care center is often a good source of care if your doctor's office is closed or you can't get an appointment. CALL BACK IF: * Fever over 104 F (40 C) * Difficulty breathing occurs * You become worse CARE ADVICE given per Influenza (Flu) - Seasonal (Adult) guideline. After Care Instructions Given Call Event Type User Date / Time Description Education document email Unk Pinto RN, Destiny 09/24/2023 7:55:41 PM [facility]ainer Connect Now Instructions Referrals [facility]

## 2023-09-29 ENCOUNTER — Encounter: Payer: Self-pay | Admitting: Family Medicine

## 2023-10-20 ENCOUNTER — Ambulatory Visit: Payer: BC Managed Care – PPO | Admitting: Dermatology

## 2023-11-14 ENCOUNTER — Encounter: Payer: Self-pay | Admitting: Family Medicine

## 2023-11-15 ENCOUNTER — Other Ambulatory Visit: Payer: Self-pay

## 2023-11-15 MED ORDER — BUPROPION HCL ER (XL) 150 MG PO TB24: 150.0000 mg | ORAL_TABLET | Freq: Every day | ORAL | 3 refills | Status: AC

## 2024-01-09 ENCOUNTER — Ambulatory Visit: Admitting: Dermatology

## 2024-03-12 ENCOUNTER — Encounter: Payer: Self-pay | Admitting: Physician Assistant

## 2024-03-12 ENCOUNTER — Ambulatory Visit: Admitting: Physician Assistant

## 2024-03-12 VITALS — BP 141/84

## 2024-03-12 DIAGNOSIS — Z1283 Encounter for screening for malignant neoplasm of skin: Secondary | ICD-10-CM | POA: Diagnosis not present

## 2024-03-12 DIAGNOSIS — W908XXA Exposure to other nonionizing radiation, initial encounter: Secondary | ICD-10-CM

## 2024-03-12 DIAGNOSIS — L578 Other skin changes due to chronic exposure to nonionizing radiation: Secondary | ICD-10-CM | POA: Diagnosis not present

## 2024-03-12 DIAGNOSIS — D1801 Hemangioma of skin and subcutaneous tissue: Secondary | ICD-10-CM

## 2024-03-12 DIAGNOSIS — L814 Other melanin hyperpigmentation: Secondary | ICD-10-CM

## 2024-03-12 DIAGNOSIS — L739 Follicular disorder, unspecified: Secondary | ICD-10-CM

## 2024-03-12 DIAGNOSIS — I8393 Asymptomatic varicose veins of bilateral lower extremities: Secondary | ICD-10-CM

## 2024-03-12 DIAGNOSIS — L821 Other seborrheic keratosis: Secondary | ICD-10-CM | POA: Diagnosis not present

## 2024-03-12 DIAGNOSIS — I781 Nevus, non-neoplastic: Secondary | ICD-10-CM

## 2024-03-12 DIAGNOSIS — D229 Melanocytic nevi, unspecified: Secondary | ICD-10-CM

## 2024-03-12 MED ORDER — CLINDAMYCIN PHOSPHATE 1 % EX SOLN: Freq: Every day | CUTANEOUS | 0 refills | Status: AC

## 2024-03-12 NOTE — Progress Notes (Signed)
   New Patient Visit   Subjective  Juan Boone is a 33 y.o. male who presents for the following: Skin Cancer Screening and Full Body Skin Exam - no history of skin cancer. He is not sure of family history - he is adopted.  The patient presents for Total-Body Skin Exam (TBSE) for skin cancer screening and mole check. The patient has spots, moles and lesions to be evaluated, some may be new or changing and the patient may have concern these could be cancer.  Concern(s): bumps on scalp and legs that he admit to picking at.     The following portions of the chart were reviewed this encounter and updated as appropriate: medications, allergies, medical history  Review of Systems:  No other skin or systemic complaints except as noted in HPI or Assessment and Plan.  Objective  Well appearing patient in no apparent distress; mood and affect are within normal limits.  A full examination was performed including scalp, head, eyes, ears, nose, lips, neck, chest, axillae, abdomen, back, buttocks, bilateral upper extremities, bilateral lower extremities, hands, feet, fingers, toes, fingernails, and toenails. All findings within normal limits unless otherwise noted below.   Relevant physical exam findings are noted in the Assessment and Plan.    Assessment & Plan   SKIN CANCER SCREENING PERFORMED TODAY.  ACTINIC DAMAGE - Chronic condition, secondary to cumulative UV/sun exposure - diffuse scaly erythematous macules with underlying dyspigmentation - Recommend daily broad spectrum sunscreen SPF 30+ to sun-exposed areas, reapply every 2 hours as needed.  - Staying in the shade or wearing long sleeves, sun glasses (UVA+UVB protection) and wide brim hats (4-inch brim around the entire circumference of the hat) are also recommended for sun protection.  - Call for new or changing lesions.  LENTIGINES, SEBORRHEIC KERATOSES, HEMANGIOMAS - Benign normal skin lesions - Benign-appearing - Call for any  changes  MELANOCYTIC NEVI - Tan-brown and/or pink-flesh-colored symmetric macules and papules - Benign appearing on exam today - Observation - Call clinic for new or changing moles - Recommend daily use of broad spectrum spf 30+ sunscreen to sun-exposed areas.   FOLLICULITIS Exam: Perifollicular erythematous papules and pustules  Folliculitis occurs due to inflammation of the superficial hair follicle (pore), resulting in acne-like lesions (pus bumps). It can be infectious (bacterial, fungal) or noninfectious (shaving, tight clothing, heat/sweat, medications).  Folliculitis can be acute or chronic and recommended treatment depends on the underlying cause of folliculitis.  Treatment Plan: Clindamycin  solution Apply to scalp and legs daily as needed.   Varicose Veins/Spider Veins - Dilated blue, purple or red veins at the lower extremities - Reassured. Recommend compression socks. - Smaller vessels can be treated by sclerotherapy (a procedure to inject a medicine into the veins to make them disappear) if desired, but the treatment is not covered by insurance. Larger vessels may be covered if symptomatic and we would refer to vascular surgeon if treatment desired.    SCREENING EXAM FOR SKIN CANCER   ACTINIC SKIN DAMAGE   LENTIGINES   SEBORRHEIC KERATOSIS   CHERRY ANGIOMA   MULTIPLE BENIGN NEVI   FOLLICULITIS   ASYMPTOMATIC VARICOSE VEINS OF BOTH LOWER EXTREMITIES    Return in about 1 year (around 03/12/2025) for TBSE.  I, Roseline Hutchinson, CMA, am acting as scribe for Kindsey Eblin K, PA-C .   Documentation: I have reviewed the above documentation for accuracy and completeness, and I agree with the above.  Girtha Kilgore K, PA-C

## 2024-03-12 NOTE — Patient Instructions (Signed)

## 2024-04-02 ENCOUNTER — Encounter: Payer: Self-pay | Admitting: Family Medicine

## 2024-04-02 ENCOUNTER — Other Ambulatory Visit: Payer: Self-pay | Admitting: Family Medicine

## 2024-04-02 DIAGNOSIS — M79673 Pain in unspecified foot: Secondary | ICD-10-CM

## 2024-04-02 NOTE — Telephone Encounter (Signed)
 Please see patient message and advise on working patient in for physical.

## 2024-04-10 ENCOUNTER — Ambulatory Visit (INDEPENDENT_AMBULATORY_CARE_PROVIDER_SITE_OTHER): Admitting: Family Medicine

## 2024-04-10 ENCOUNTER — Other Ambulatory Visit: Payer: Self-pay

## 2024-04-10 ENCOUNTER — Ambulatory Visit

## 2024-04-10 VITALS — BP 140/88 | HR 69 | Ht 70.0 in | Wt 218.0 lb

## 2024-04-10 DIAGNOSIS — G8929 Other chronic pain: Secondary | ICD-10-CM

## 2024-04-10 DIAGNOSIS — M25571 Pain in right ankle and joints of right foot: Secondary | ICD-10-CM

## 2024-04-10 MED ORDER — CELECOXIB 200 MG PO CAPS: 200.0000 mg | ORAL_CAPSULE | Freq: Two times a day (BID) | ORAL | 1 refills | Status: AC

## 2024-04-10 NOTE — Patient Instructions (Addendum)
 Thank you for coming in today.   Please work on the home exercises the athletic trainer went over with you:  View at [email or web address] code [id number]  I've sent a prescription for Celebrex  to your pharmacy.   Check back if not getting better

## 2024-04-10 NOTE — Progress Notes (Signed)
   LILLETTE Ileana Collet, PhD, LAT, ATC acting as a scribe for Artist Lloyd, MD.   Juan Boone is a 33 y.o. male who presents to [facility] today for R heel pain x 4wks. Pain started after a trail run and jumped across the creek, landing on a much harder surface than expected. Ankle pain has improved, but he heel pain in painful. Pt locates pain to plantar aspect of the R calcaneous and onto the posterior aspect.   Swelling: yes Aggravates: worse in the morning, worsens throughout the day Treatments tried: rest, ice, ankle brace, elevation,   Pertinent review of systems: No fevers or chills  Relevant historical information: Migraine headache.  Active runner.   Exam:  BP (!) 140/88   Pulse 69   Ht 5' 10 (1.778 m)   Wt 218 lb (98.9 kg)   SpO2 98%   BMI 31.28 kg/m  General: Well Developed, well nourished, and in no acute distress.   MSK: Right ankle normal-appearing Tender palpation medial joint and along the course of the posterior tibialis tendon.  Nontender palpation posterior calcaneus.  Normal foot and ankle motion.  Intact strength.  Some pain with resisted foot plantarflexion and inversion. Pulses cap refill and sensation are intact distally.    Lab and Radiology Results  Diagnostic Limited MSK Ultrasound of: Right ankle Posterior ankle normal-appearing Achilles tendon. Medial ankle intact posterior tibialis tendon with some hypoechoic fluid surrounding the tendon within the tendon sheath around the tarsal tunnel area. Impression: Posterior tibialis tenosynovitis  X-ray images right ankle obtained today personally and independently interpreted. No acute fractures.  No severe degenerative changes. Await formal radiology review.   Assessment and Plan: 33 y.o. male with right medial ankle pain occurring ongoing for months after an injury while running.  Pain due primary dominantly to posterior tibialis tenosynovitis.  Plan for compression  sleeve Voltaren  gel and home exercise program.  If not improved consider PT and/or MRI or both. Celebrex  also prescribed.  PDMP not reviewed this encounter. Orders Placed This Encounter  Procedures   DG Ankle Complete Right    Standing Status:   Future    Number of Occurrences:   1    Expiration Date:   04/10/2025    Reason for Exam (SYMPTOM  OR DIAGNOSIS REQUIRED):   right ankle pain    Preferred imaging location?:   [facility]   US  LIMITED JOINT SPACE STRUCTURES LOW RIGHT(NO LINKED CHARGES)    Reason for Exam (SYMPTOM  OR DIAGNOSIS REQUIRED):   right heel pain    Preferred imaging location?:   [facility] ordered this encounter  Medications   celecoxib  (CELEBREX ) 200 MG capsule    Sig: Take 1 capsule (200 mg total) by mouth 2 (two) times daily.    Dispense:  14 capsule    Refill:  1     Discussed warning signs or symptoms. Please see discharge instructions. Patient expresses understanding.   The above documentation has been reviewed and is accurate and complete Artist Lloyd, M.D.

## 2024-04-19 ENCOUNTER — Ambulatory Visit: Payer: Self-pay | Admitting: Family Medicine

## 2024-04-19 NOTE — Progress Notes (Signed)
Right ankle x-ray looks okay to radiology.

## 2024-05-16 ENCOUNTER — Encounter: Payer: Self-pay | Admitting: Family Medicine

## 2024-05-16 NOTE — Telephone Encounter (Signed)
 Okay to email

## 2024-05-17 ENCOUNTER — Ambulatory Visit: Admitting: Dermatology

## 2024-06-14 ENCOUNTER — Ambulatory Visit (INDEPENDENT_AMBULATORY_CARE_PROVIDER_SITE_OTHER): Admitting: Family Medicine

## 2024-06-14 ENCOUNTER — Encounter: Payer: Self-pay | Admitting: Family Medicine

## 2024-06-14 VITALS — BP 112/72 | HR 78 | Temp 98.2°F | Ht 70.0 in | Wt 219.4 lb

## 2024-06-14 DIAGNOSIS — Z13220 Encounter for screening for lipoid disorders: Secondary | ICD-10-CM

## 2024-06-14 DIAGNOSIS — Z130 Encounter for screening for diseases of the blood and blood-forming organs and certain disorders involving the immune mechanism: Secondary | ICD-10-CM

## 2024-06-14 DIAGNOSIS — E669 Obesity, unspecified: Secondary | ICD-10-CM | POA: Diagnosis not present

## 2024-06-14 DIAGNOSIS — Z0000 Encounter for general adult medical examination without abnormal findings: Secondary | ICD-10-CM | POA: Diagnosis not present

## 2024-06-14 DIAGNOSIS — Z131 Encounter for screening for diabetes mellitus: Secondary | ICD-10-CM | POA: Diagnosis not present

## 2024-06-14 NOTE — Patient Instructions (Addendum)
 Consider retrial e66m since that worked for you  Goal exercise 3-4 days a week   Goal weight loss 0.5- 1 pounds - progress over perfection especially with this busy season  Message me monthly with updates  [email or web address] or [email or web address] for free workouts  Please stop by lab before you go If you have mychart- we will send your results within 3 business days of us  receiving them.  If you do not have mychart- we will call you about results within 5 business days of us  receiving them.  *please also note that you will see labs on mychart as soon as they post. I will later go in and write notes on them- will say notes from Dr. Katrinka   Recommended follow up: Return in about 1 year (around 06/14/2025) for physical or sooner if needed.Schedule b4 you leave.

## 2024-06-14 NOTE — Progress Notes (Signed)
 Phone: [phone]    Subjective:  Patient presents today for their annual physical. Chief complaint-noted.   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     Medications- reviewed and updated Current Outpatient Medications  Medication Sig Dispense Refill   celecoxib  (CELEBREX ) 200 MG capsule Take 1 capsule (200 mg total) by mouth 2 (two) times daily. 14 capsule 1   clindamycin  (CLEOCIN  T) 1 % external solution Apply topically daily. Apply to affected areas of scalp and legs as needed. 30 mL 0   LACTASE ENZYME PO Take 1 packet by mouth 3 (three) times daily with meals as needed (lactose intolerance).     buPROPion  (WELLBUTRIN  XL) 150 MG 24 hr tablet Take 1 tablet (150 mg total) by mouth daily. (Patient not taking: Reported on 06/14/2024) 90 tablet 3   No current facility-administered medications for this visit.    Allergies-reviewed and updated Allergies  Allergen Reactions   Gluten Meal Other (See Comments)    Gi upset   Lactose Intolerance (Gi) Diarrhea and Other (See Comments)    Gi upset   Aleve  [Naproxen Sodium] Other (See Comments)    GI upset    Social History   Social History Narrative   Married. Expecting 1st child March 11 2023. And they will be moving in 2025.    Still has in 2025- 24 lb mutt. Wife expecting again in April 2026.    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 Oropharynx normal Neck: no thyromegaly CV: RRR no murmurs rubs or gallops Lungs: CTAB no crackles, wheeze, rhonchi Abdomen: soft/nontender/nondistended/normal bowel sounds. No rebound or guarding.  Ext: no edema Skin: warm, dry Neuro: grossly normal, moves all extremities, PERRLA    Assessment and Plan:  33 y.o. male presenting for annual physical.  Health Maintenance counseling: 1. Anticipatory guidance: Patient counseled regarding regular dental exams -q6 months, eye exams - no vision issues,  avoiding smoking and second hand smoke , limiting alcohol to 2 beverages per day- max 3 a week, no illicit drugs.   2. Risk factor reduction:  Advised patient of need for regular exercise and diet rich and fruits and vegetables to reduce risk of heart attack and stroke.  Exercise- set back with ankle issues but wants to get back to running- wants to restart.  Diet/weight management-weight trended up with fatherhood- up 23 lbs-trying to do whol food/organic products- had to eat out slightly more . Has done weight watchers in past and e60m in past as well  as some calorie counting.  Wt Readings from Last 3 Encounters:  06/14/24 219 lb 6.4 oz (99.5 kg)  04/10/24 218 lb (98.9 kg)  09/26/23 185 lb 6.4 oz (84.1 kg)  3. Immunizations/screenings/ancillary studies-declines flu, COVID  Immunization History  Administered Date(s) Administered   DTP 03/04/1989, 05/06/1989, 07/08/1989, 04/17/1990   Fluad Quad(high Dose 65+) 05/26/2019   HIB, Unspecified 04/17/1990   Influenza Inj Mdck Quad Pf 07/22/2017, 05/26/2019   Influenza Nasal 07/25/2008   Influenza, Mdck, Trivalent,PF 6+ MOS(egg free) 04/26/2023   Influenza,inj,Quad PF,6+ Mos 06/25/2018   Influenza-Unspecified 08/26/2016   MMR 04/17/1990   Meningococcal Acwy, Unspecified 10/26/2006   Moderna Covid-19 Fall Seasonal Vaccine 8yrs & older 04/26/2023   Novel Infuenza-h1n1-09 07/25/2008   OPV 03/04/1989, 05/06/1989, 04/17/1990   PFIZER(Purple Top)SARS-COV-2 Vaccination 10/08/2019, 11/08/2019, 05/30/2020   Tdap 09/09/2016  4. Prostate cancer screening- no family history, start at age 54    5. Colon cancer screening - crohns in family but no polyps- no abdominal pain or blood in stool. Had colonoscopy 2019 with 10 year repeat 6. Skin cancer screening/prevention- sees dermatology. advised regular sunscreen use. Denies worrisome, changing, or new skin lesions.  7. Testicular cancer screening- advised monthly self exams 8. STD screening- patient opts out- only active with wife 9. Smoking associated screening- never smoker  Status of chronic or acute concerns   #attention deficit disorder- tolerating without Wellbutrin  in 2025 - going of the restart exercise but will reach out  #hyperlipidemia S: Medication:none  Lab Results  Component Value Date   CHOL 197 11/22/2022   HDL 54.70 11/22/2022   LDLCALC 127 (H) 11/22/2022   TRIG 74.0 11/22/2022   CHOLHDL 4 11/22/2022   A/P: mildly elevated cholesterol but not in range for medicine  #thumb pain- possible arthritis- still bothering  him  Recommended follow up: Return in about 1 year (around 06/14/2025) for physical or sooner if needed.Schedule b4 you leave. Future Appointments  Date Time Provider Department Center  03/13/2025  3:15 PM Orman Erminio POUR, PA-C [facility] None   Lab/Order associations:NOT fasting   ICD-10-CM   1. Preventative health care  Z00.00     2. Screening for hyperlipidemia  Z13.220 Comprehensive metabolic panel with GFR    Lipid panel    3. Screening for deficiency anemia  Z13.0 CBC with Differential/Platelet    4. Screening for diabetes mellitus  Z13.1 Hemoglobin A1c    5. Obesity (BMI 30-39.9)  E66.9 Hemoglobin A1c      No orders of the defined types were placed in this encounter.   Return precautions advised.   Garnette Lukes, MD

## 2024-06-14 NOTE — Addendum Note (Signed)
 Addended by: KATRINKA GARNETTE KIDD on: 06/14/2024 03:47 PM   Modules accepted: Level of Service

## 2024-06-15 ENCOUNTER — Ambulatory Visit: Payer: Self-pay | Admitting: Family Medicine

## 2024-06-15 LAB — COMPREHENSIVE METABOLIC PANEL WITH GFR
ALT: 45 U/L (ref 0–53)
AST: 24 U/L (ref 0–37)
Albumin: 4.6 g/dL (ref 3.5–5.2)
Alkaline Phosphatase: 55 U/L (ref 39–117)
BUN: 19 mg/dL (ref 6–23)
CO2: 30 meq/L (ref 19–32)
Calcium: 9.4 mg/dL (ref 8.4–10.5)
Chloride: 101 meq/L (ref 96–112)
Creatinine, Ser: 1.45 mg/dL (ref 0.40–1.50)
GFR: 62.46 mL/min (ref >60.00)
Glucose, Bld: 75 mg/dL (ref 70–99)
Potassium: 4.1 meq/L (ref 3.5–5.1)
Sodium: 141 meq/L (ref 135–145)
Total Bilirubin: 0.5 mg/dL (ref 0.2–1.2)
Total Protein: 6.8 g/dL (ref 6.0–8.3)

## 2024-06-15 LAB — CBC WITH DIFFERENTIAL/PLATELET
Basophils Absolute: 0.1 K/uL (ref 0.0–0.1)
Basophils Relative: 0.9 % (ref 0.0–3.0)
Eosinophils Absolute: 0.2 K/uL (ref 0.0–0.7)
Eosinophils Relative: 2.4 % (ref 0.0–5.0)
HCT: 45.4 % (ref 39.0–52.0)
Hemoglobin: 15.1 g/dL (ref 13.0–17.0)
Lymphocytes Relative: 24 % (ref 12.0–46.0)
Lymphs Abs: 2 K/uL (ref 0.7–4.0)
MCHC: 33.2 g/dL (ref 30.0–36.0)
MCV: 86.5 fl (ref 78.0–100.0)
Monocytes Absolute: 1 K/uL (ref 0.1–1.0)
Monocytes Relative: 12.1 % — ABNORMAL HIGH (ref 3.0–12.0)
Neutro Abs: 5 K/uL (ref 1.4–7.7)
Neutrophils Relative %: 60.6 % (ref 43.0–77.0)
Platelets: 227 K/uL (ref 150.0–400.0)
RBC: 5.25 Mil/uL (ref 4.22–5.81)
RDW: 13.3 % (ref 11.5–15.5)
WBC: 8.2 K/uL (ref 4.0–10.5)

## 2024-06-15 LAB — LIPID PANEL
Cholesterol: 230 mg/dL — ABNORMAL HIGH (ref 0–200)
HDL: 47.3 mg/dL (ref >39.00)
LDL Cholesterol: 132 mg/dL — ABNORMAL HIGH (ref 0–99)
NonHDL: 182.54
Total CHOL/HDL Ratio: 5
Triglycerides: 254 mg/dL — ABNORMAL HIGH (ref 0.0–149.0)
VLDL: 50.8 mg/dL — ABNORMAL HIGH (ref 0.0–40.0)

## 2024-06-15 LAB — HEMOGLOBIN A1C: Hgb A1c MFr Bld: 5.8 % (ref 4.6–6.5)

## 2024-09-14 ENCOUNTER — Encounter: Payer: Self-pay | Admitting: Family Medicine

## 2025-03-13 ENCOUNTER — Ambulatory Visit: Admitting: Physician Assistant

## 2025-06-17 ENCOUNTER — Encounter: Admitting: Family Medicine
